# Patient Record
Sex: Female | Born: 1960 | Race: White | Hispanic: No | Smoking: Current every day smoker
Health system: Southern US, Community
[De-identification: ages and names within clinical notes are randomized; demographics above are authoritative.]

## PROBLEM LIST (undated history)

## (undated) DIAGNOSIS — K219 Gastro-esophageal reflux disease without esophagitis: Secondary | ICD-10-CM

## (undated) DIAGNOSIS — I10 Essential (primary) hypertension: Secondary | ICD-10-CM

## (undated) HISTORY — DX: Essential (primary) hypertension: I10

---

## 2010-08-13 ENCOUNTER — Encounter: Payer: Self-pay | Admitting: Family Medicine

## 2010-08-13 ENCOUNTER — Inpatient Hospital Stay (INDEPENDENT_AMBULATORY_CARE_PROVIDER_SITE_OTHER)
Admission: RE | Admit: 2010-08-13 | Discharge: 2010-08-13 | Disposition: A | Discharge status: Home / Self Care | Payer: BC Managed Care – PPO | Source: Ambulatory Visit | Attending: Family Medicine | Admitting: Family Medicine

## 2010-08-13 DIAGNOSIS — M7710 Lateral epicondylitis, unspecified elbow: Secondary | ICD-10-CM

## 2010-08-13 DIAGNOSIS — M533 Sacrococcygeal disorders, not elsewhere classified: Secondary | ICD-10-CM

## 2010-08-13 DIAGNOSIS — M65849 Other synovitis and tenosynovitis, unspecified hand: Secondary | ICD-10-CM

## 2010-08-13 DIAGNOSIS — S239XXA Sprain of unspecified parts of thorax, initial encounter: Secondary | ICD-10-CM

## 2010-08-13 DIAGNOSIS — M7520 Bicipital tendinitis, unspecified shoulder: Secondary | ICD-10-CM

## 2010-08-17 ENCOUNTER — Ambulatory Visit (INDEPENDENT_AMBULATORY_CARE_PROVIDER_SITE_OTHER): Payer: BC Managed Care – PPO | Admitting: Family Medicine

## 2010-08-17 ENCOUNTER — Encounter: Payer: Self-pay | Admitting: Family Medicine

## 2010-08-17 VITALS — BP 120/84 | HR 78 | Temp 98.0°F | Ht 60.0 in | Wt 111.4 lb

## 2010-08-17 DIAGNOSIS — S56919A Strain of unspecified muscles, fascia and tendons at forearm level, unspecified arm, initial encounter: Secondary | ICD-10-CM | POA: Insufficient documentation

## 2010-08-17 DIAGNOSIS — IMO0002 Reserved for concepts with insufficient information to code with codable children: Secondary | ICD-10-CM

## 2010-08-17 DIAGNOSIS — M542 Cervicalgia: Secondary | ICD-10-CM | POA: Insufficient documentation

## 2010-08-17 DIAGNOSIS — S239XXA Sprain of unspecified parts of thorax, initial encounter: Secondary | ICD-10-CM

## 2010-08-17 DIAGNOSIS — M549 Dorsalgia, unspecified: Secondary | ICD-10-CM

## 2010-08-17 DIAGNOSIS — S29019A Strain of muscle and tendon of unspecified wall of thorax, initial encounter: Secondary | ICD-10-CM

## 2010-08-17 MED ORDER — CYCLOBENZAPRINE HCL 10 MG PO TABS: 5.0000 mg | ORAL_TABLET | Freq: Three times a day (TID) | ORAL | Status: DC | PRN

## 2010-08-17 MED ORDER — TRAMADOL HCL 50 MG PO TABS: 50.0000 mg | ORAL_TABLET | Freq: Four times a day (QID) | ORAL | Status: DC | PRN

## 2010-08-17 NOTE — Patient Instructions (Signed)
Your exam is consistent with muscle strains related to overuse at work (from going 6 months without doing twisting, lifting, etc to doing this for 8 hours straight) and lifting things at home. Start physical therapy for your back and go 1-2 times a week up to 6 weeks. Out of work for the next 2 weeks until I see you back then I anticipate a return to work program. Continue with the flexeril as needed for muscle spasms. Take tylenol extra strength 2 tabs three times a day for pain. Tramadol 50mg  up to 4 times a day as needed for severe pain (no driving on this medicine). Heat 15 minutes at a time 3-4 times a day helps more with muscle strain/spasm. Follow up with me in 2 weeks for a recheck.

## 2010-08-17 NOTE — Assessment & Plan Note (Signed)
Lumbar, thoracic, forearm strains - patient's history and symptoms are consistent with overuse muscle strains of her back and left upper extremity.  She had been out of work from her physically demanding job for 6 months then went back to full duty 8+ hours (also moving boxes and furniture at home), leading to her current pain.  No red flag symptoms.  No findings to warrant imaging at this time.  No evidence of radiculopathy or tennis elbow.  Should resolve with time, heat, muscle relaxants, PT, pain management.  However, will need to be eased back into work if she is to continue with her current job description.  Anticipate doing so at follow-up in 2 weeks (2 hour days, transition over a few weeks to complete days).  See instructions for further.

## 2010-08-17 NOTE — Assessment & Plan Note (Signed)
Lumbar, thoracic, forearm strains - patient's history and symptoms are consistent with overuse muscle strains of her back and left upper extremity.  She had been out of work from her physically demanding job for 6 months then went back to full duty 8+ hours (also moving boxes and furniture at home), leading to her current pain.  No red flag symptoms.  No findings to warrant imaging at this time.  No evidence of radiculopathy or tennis elbow.  Should resolve with time, heat, muscle relaxants, PT, pain management.  However, will need to be eased back into work if she is to continue with her current job description.  Anticipate doing so at follow-up in 2 weeks (2 hour days, transition over a few weeks to complete days).  See instructions for further. 
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
## 2010-08-23 ENCOUNTER — Ambulatory Visit: Payer: BC Managed Care – PPO | Attending: Family Medicine | Admitting: Physical Therapy

## 2010-08-23 DIAGNOSIS — M6281 Muscle weakness (generalized): Secondary | ICD-10-CM | POA: Insufficient documentation

## 2010-08-23 DIAGNOSIS — M546 Pain in thoracic spine: Secondary | ICD-10-CM | POA: Insufficient documentation

## 2010-08-23 DIAGNOSIS — IMO0001 Reserved for inherently not codable concepts without codable children: Secondary | ICD-10-CM | POA: Insufficient documentation

## 2010-08-23 DIAGNOSIS — M542 Cervicalgia: Secondary | ICD-10-CM | POA: Insufficient documentation

## 2010-08-27 ENCOUNTER — Ambulatory Visit: Payer: BC Managed Care – PPO | Admitting: Physical Therapy

## 2010-08-30 ENCOUNTER — Encounter: Payer: BC Managed Care – PPO | Admitting: Physical Therapy

## 2010-08-31 ENCOUNTER — Ambulatory Visit (INDEPENDENT_AMBULATORY_CARE_PROVIDER_SITE_OTHER): Payer: BC Managed Care – PPO | Admitting: Family Medicine

## 2010-08-31 ENCOUNTER — Encounter: Payer: Self-pay | Admitting: Family Medicine

## 2010-08-31 VITALS — BP 135/81 | HR 92 | Temp 98.1°F | Ht 60.0 in | Wt 110.0 lb

## 2010-08-31 DIAGNOSIS — S29019A Strain of muscle and tendon of unspecified wall of thorax, initial encounter: Secondary | ICD-10-CM

## 2010-08-31 DIAGNOSIS — S239XXA Sprain of unspecified parts of thorax, initial encounter: Secondary | ICD-10-CM

## 2010-09-02 ENCOUNTER — Encounter: Payer: BC Managed Care – PPO | Admitting: Physical Therapy

## 2010-09-03 ENCOUNTER — Encounter: Payer: Self-pay | Admitting: Family Medicine
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
## 2010-09-03 NOTE — Assessment & Plan Note (Signed)
1. Lumbar, thoracic, forearm strains - patient's history and exam are mildly improved from last visit.  Continue with PT, home exercises, Tylenol, Tramadol heat.  Will return to work on Monday with limited hours only - 2 hours a day and slowly increase back to regular work activities and schedule over the next several weeks.

## 2010-09-06 ENCOUNTER — Ambulatory Visit: Payer: BC Managed Care – PPO | Attending: Family Medicine | Admitting: Physical Therapy

## 2010-09-06 DIAGNOSIS — M542 Cervicalgia: Secondary | ICD-10-CM | POA: Insufficient documentation

## 2010-09-06 DIAGNOSIS — IMO0001 Reserved for inherently not codable concepts without codable children: Secondary | ICD-10-CM | POA: Insufficient documentation

## 2010-09-06 DIAGNOSIS — M6281 Muscle weakness (generalized): Secondary | ICD-10-CM | POA: Insufficient documentation

## 2010-09-06 DIAGNOSIS — M546 Pain in thoracic spine: Secondary | ICD-10-CM | POA: Insufficient documentation

## 2010-09-09 ENCOUNTER — Encounter: Payer: BC Managed Care – PPO | Admitting: Physical Therapy

## 2010-09-13 ENCOUNTER — Ambulatory Visit: Payer: BC Managed Care – PPO | Admitting: Physical Therapy

## 2010-09-14 ENCOUNTER — Ambulatory Visit (HOSPITAL_BASED_OUTPATIENT_CLINIC_OR_DEPARTMENT_OTHER)
Admission: RE | Admit: 2010-09-14 | Discharge: 2010-09-14 | Disposition: A | Discharge status: Home / Self Care | Payer: BC Managed Care – PPO | Source: Ambulatory Visit | Attending: Family Medicine | Admitting: Family Medicine

## 2010-09-14 ENCOUNTER — Ambulatory Visit: Payer: BC Managed Care – PPO | Admitting: Family Medicine

## 2010-09-14 ENCOUNTER — Other Ambulatory Visit: Payer: Self-pay | Admitting: Family Medicine

## 2010-09-14 ENCOUNTER — Encounter: Payer: Self-pay | Admitting: Family Medicine

## 2010-09-14 DIAGNOSIS — M546 Pain in thoracic spine: Secondary | ICD-10-CM

## 2010-09-14 DIAGNOSIS — M542 Cervicalgia: Secondary | ICD-10-CM

## 2010-09-14 NOTE — Progress Notes (Deleted)
  Subjective:    Patient ID: Stephanie Mayer, female    DOB: 08/24/1960, 50 y.o.   MRN: [id number]  HPI    Review of Systems     Objective:   Physical Exam        Assessment & Plan:

## 2010-09-15 ENCOUNTER — Encounter: Payer: Self-pay | Admitting: Family Medicine
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
## 2010-09-17 ENCOUNTER — Ambulatory Visit: Payer: BC Managed Care – PPO | Admitting: Physical Therapy

## 2010-09-20 ENCOUNTER — Ambulatory Visit: Payer: BC Managed Care – PPO | Admitting: Physical Therapy

## 2010-09-23 ENCOUNTER — Ambulatory Visit: Payer: BC Managed Care – PPO

## 2010-09-28 ENCOUNTER — Ambulatory Visit: Payer: BC Managed Care – PPO | Admitting: Physical Therapy

## 2010-09-30 ENCOUNTER — Ambulatory Visit: Payer: BC Managed Care – PPO | Admitting: Physical Therapy

## 2010-10-04 ENCOUNTER — Encounter: Payer: BC Managed Care – PPO | Admitting: Physical Therapy

## 2010-10-05 ENCOUNTER — Encounter: Payer: Self-pay | Admitting: Family Medicine

## 2010-10-05 ENCOUNTER — Ambulatory Visit (INDEPENDENT_AMBULATORY_CARE_PROVIDER_SITE_OTHER): Payer: BC Managed Care – PPO | Admitting: Family Medicine

## 2010-10-05 VITALS — BP 137/88 | HR 90 | Temp 98.4°F | Ht 60.0 in | Wt 108.0 lb

## 2010-10-05 DIAGNOSIS — F329 Major depressive disorder, single episode, unspecified: Secondary | ICD-10-CM
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
## 2010-10-05 NOTE — Patient Instructions (Signed)
As we discussed before, we have to continue advancing you back to work. Starting Monday 8/6 can work 8 hour days, 8/13 10 hour days, and finall back to full duty on 8/20 - though you must be released to do your physical therapy twice a week for the next 4 weeks. Continue with home exercises and stretches. Continue heat, ok to take tylenol, tramadol as needed for pain. We will refer you to someone to talk more about the loss of your husband. Follow up with me in 4 weeks after you have finished physical therapy.

## 2010-10-07 ENCOUNTER — Ambulatory Visit: Payer: BC Managed Care – PPO | Attending: Family Medicine | Admitting: Physical Therapy

## 2010-10-07 DIAGNOSIS — M546 Pain in thoracic spine: Secondary | ICD-10-CM | POA: Insufficient documentation

## 2010-10-07 DIAGNOSIS — M6281 Muscle weakness (generalized): Secondary | ICD-10-CM | POA: Insufficient documentation

## 2010-10-07 DIAGNOSIS — IMO0001 Reserved for inherently not codable concepts without codable children: Secondary | ICD-10-CM | POA: Insufficient documentation

## 2010-10-07 DIAGNOSIS — M542 Cervicalgia: Secondary | ICD-10-CM | POA: Insufficient documentation

## 2010-10-12 ENCOUNTER — Ambulatory Visit: Payer: BC Managed Care – PPO | Admitting: Physical Therapy

## 2010-10-14 ENCOUNTER — Ambulatory Visit: Payer: BC Managed Care – PPO | Admitting: Physical Therapy

## 2010-10-18 ENCOUNTER — Ambulatory Visit: Payer: BC Managed Care – PPO | Admitting: Physical Therapy

## 2010-10-20 ENCOUNTER — Ambulatory Visit: Payer: BC Managed Care – PPO | Admitting: Physical Therapy

## 2010-10-22 ENCOUNTER — Encounter: Payer: BC Managed Care – PPO | Admitting: Physical Therapy

## 2010-10-25 ENCOUNTER — Encounter: Payer: BC Managed Care – PPO | Admitting: Physical Therapy

## 2010-10-26 ENCOUNTER — Ambulatory Visit: Payer: BC Managed Care – PPO | Admitting: Physical Therapy

## 2010-10-28 ENCOUNTER — Encounter: Payer: BC Managed Care – PPO | Admitting: Physical Therapy

## 2010-10-28 ENCOUNTER — Ambulatory Visit: Payer: BC Managed Care – PPO | Admitting: Physical Therapy

## 2010-11-01 ENCOUNTER — Encounter: Payer: BC Managed Care – PPO | Admitting: Physical Therapy

## 2010-11-02 ENCOUNTER — Encounter: Payer: Self-pay | Admitting: Family Medicine

## 2010-11-02 ENCOUNTER — Ambulatory Visit (INDEPENDENT_AMBULATORY_CARE_PROVIDER_SITE_OTHER): Payer: BC Managed Care – PPO | Admitting: Family Medicine

## 2010-11-02 DIAGNOSIS — S29019A Strain of muscle and tendon of unspecified wall of thorax, initial encounter: Secondary | ICD-10-CM

## 2010-11-02 DIAGNOSIS — F4321 Adjustment disorder with depressed mood: Secondary | ICD-10-CM

## 2010-11-02 DIAGNOSIS — S239XXA Sprain of unspecified parts of thorax, initial encounter: Secondary | ICD-10-CM
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
## 2010-11-02 NOTE — Assessment & Plan Note (Signed)
much improved though pain still a 3/10.  Continue with PT as this is helping.  Tylenol or aspirin as needed for pain.  Has stopped flexeril and tramadol.  See prior notes regarding work - has been cleared to go back medically and paperwork faxed to her employer.  This is being handled by her union as well per her report.   Discussed upper body strengthening along with her PT to minimize risk that pain recurs when she returns to work.  F/u in 4-6 weeks after she has completed PT.

## 2010-11-02 NOTE — Assessment & Plan Note (Signed)
?  Depression vs adjustment disorder vs normal bereavement - Has not yet heard from [facility] - we will contact them regarding this referral.

## 2010-11-04 ENCOUNTER — Encounter: Payer: BC Managed Care – PPO | Admitting: Physical Therapy

## 2010-11-04 ENCOUNTER — Ambulatory Visit: Payer: BC Managed Care – PPO | Admitting: Physical Therapy

## 2010-11-09 ENCOUNTER — Ambulatory Visit: Payer: BC Managed Care – PPO | Admitting: Physical Therapy

## 2010-11-11 ENCOUNTER — Ambulatory Visit: Payer: BC Managed Care – PPO | Attending: Family Medicine | Admitting: Physical Therapy

## 2010-11-11 DIAGNOSIS — M546 Pain in thoracic spine: Secondary | ICD-10-CM | POA: Insufficient documentation

## 2010-11-11 DIAGNOSIS — M6281 Muscle weakness (generalized): Secondary | ICD-10-CM | POA: Insufficient documentation

## 2010-11-11 DIAGNOSIS — IMO0001 Reserved for inherently not codable concepts without codable children: Secondary | ICD-10-CM | POA: Insufficient documentation

## 2010-11-11 DIAGNOSIS — M542 Cervicalgia: Secondary | ICD-10-CM | POA: Insufficient documentation

## 2010-11-16 ENCOUNTER — Ambulatory Visit: Payer: BC Managed Care – PPO | Admitting: Physical Therapy

## 2010-11-18 ENCOUNTER — Ambulatory Visit: Payer: BC Managed Care – PPO | Admitting: Physical Therapy

## 2010-11-23 ENCOUNTER — Ambulatory Visit: Payer: BC Managed Care – PPO | Admitting: Physical Therapy

## 2010-11-25 ENCOUNTER — Ambulatory Visit: Payer: BC Managed Care – PPO | Admitting: Physical Therapy

## 2010-11-30 ENCOUNTER — Ambulatory Visit: Payer: BC Managed Care – PPO | Admitting: Physical Therapy

## 2010-12-02 ENCOUNTER — Ambulatory Visit: Payer: BC Managed Care – PPO | Admitting: Physical Therapy

## 2010-12-14 ENCOUNTER — Encounter: Payer: Self-pay | Admitting: Family Medicine

## 2010-12-14 ENCOUNTER — Ambulatory Visit (INDEPENDENT_AMBULATORY_CARE_PROVIDER_SITE_OTHER): Payer: BC Managed Care – PPO | Admitting: Family Medicine

## 2010-12-14 DIAGNOSIS — S239XXA Sprain of unspecified parts of thorax, initial encounter: Secondary | ICD-10-CM

## 2010-12-14 DIAGNOSIS — F4321 Adjustment disorder with depressed mood: Secondary | ICD-10-CM

## 2010-12-14 DIAGNOSIS — S29019A Strain of muscle and tendon of unspecified wall of thorax, initial encounter: Secondary | ICD-10-CM

## 2010-12-14 NOTE — Assessment & Plan Note (Signed)
?  Depression vs adjustment disorder vs normal bereavement - She decided not to pursue treatment and feels much better - advised to call us or Behavioral Health if she changes her mind.
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
## 2010-12-14 NOTE — Assessment & Plan Note (Signed)
Significantly improved - advised to continue with home stretches and exercises indefinitely.  Tylenol as needed for pain.  Note again given for return to work.  F/u prn.
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
## 2011-02-07 NOTE — Letter (Signed)
Summary: Out of Work  [facility]  [address]   [place], [place] [address]   Phone: [phone]  Fax: [phone]    August 13, 2010   Employee:  [employer]    To Whom It May Concern:   For Medical reasons, please excuse the above named employee from work for the following dates:  Start:   08/13/10  End:  08/27/10  If you need additional information, please feel free to contact our office.         Sincerely,    Donna Christen MD

## 2011-10-03 ENCOUNTER — Emergency Department (INDEPENDENT_AMBULATORY_CARE_PROVIDER_SITE_OTHER): Payer: BC Managed Care – PPO

## 2011-10-03 ENCOUNTER — Emergency Department
Admission: EM | Admit: 2011-10-03 | Discharge: 2011-10-03 | Disposition: A | Discharge status: Home / Self Care | Payer: BC Managed Care – PPO | Source: Home / Self Care

## 2011-10-03 DIAGNOSIS — N390 Urinary tract infection, site not specified: Secondary | ICD-10-CM

## 2011-10-03 DIAGNOSIS — S39012A Strain of muscle, fascia and tendon of lower back, initial encounter: Secondary | ICD-10-CM

## 2011-10-03 DIAGNOSIS — M545 Low back pain: Secondary | ICD-10-CM

## 2011-10-03 LAB — POCT URINALYSIS DIP (MANUAL ENTRY)
Blood, UA: NEGATIVE
Ketones, POC UA: NEGATIVE
Protein Ur, POC: 30
Spec Grav, UA: 1.02 (ref 1.005–1.03)
pH, UA: 7.5 (ref 5–8)

## 2011-10-03 MED ORDER — CEPHALEXIN 500 MG PO CAPS: 500.0000 mg | ORAL_CAPSULE | Freq: Three times a day (TID) | ORAL | Status: AC

## 2011-10-03 MED ORDER — CYCLOBENZAPRINE HCL 5 MG PO TABS: 5.0000 mg | ORAL_TABLET | Freq: Every evening | ORAL | Status: AC | PRN
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
## 2011-10-03 NOTE — ED Notes (Signed)
Stephanie Mayer complains of low back pain for 3 days. She describes the pain as stabbing and 9/10 on the pain scale. She has had some fever and sweats. Denies painful urination.

## 2011-10-05 ENCOUNTER — Telehealth: Payer: Self-pay | Admitting: *Deleted

## 2011-10-05 LAB — URINE CULTURE: Colony Count: NO GROWTH

## 2011-10-06 NOTE — ED Provider Notes (Signed)
Agree with exam, assessment, and plan.   Lattie Haw, MD 10/06/11 1100

## 2012-02-06 ENCOUNTER — Emergency Department
Admission: EM | Admit: 2012-02-06 | Discharge: 2012-02-06 | Disposition: A | Discharge status: Home / Self Care | Payer: BC Managed Care – PPO | Source: Home / Self Care | Attending: Family Medicine | Admitting: Family Medicine

## 2012-02-06 ENCOUNTER — Encounter: Payer: Self-pay | Admitting: Emergency Medicine

## 2012-02-06 DIAGNOSIS — J069 Acute upper respiratory infection, unspecified: Secondary | ICD-10-CM

## 2012-02-06 DIAGNOSIS — R030 Elevated blood-pressure reading, without diagnosis of hypertension: Secondary | ICD-10-CM

## 2012-02-06 LAB — POCT CBC W AUTO DIFF (K'VILLE URGENT CARE)

## 2012-02-06 MED ORDER — DOXYCYCLINE HYCLATE 100 MG PO CAPS: 100.0000 mg | ORAL_CAPSULE | Freq: Two times a day (BID) | ORAL | Status: DC

## 2012-02-06 MED ORDER — BENZONATATE 200 MG PO CAPS: 200.0000 mg | ORAL_CAPSULE | Freq: Every day | ORAL | Status: DC

## 2012-02-06 NOTE — ED Notes (Signed)
Nausea, chills x 2 days

## 2012-02-06 NOTE — ED Provider Notes (Signed)
History     CSN: [id number]  Arrival date & time 02/06/12  4098   First MD Initiated Contact with Patient 02/06/12 [phone]      Chief Complaint  Patient presents with  . Nausea     HPI Comments: Patient complains of approximately 7 day history of non-productive mild cough.  Yesterday she awoke with chills/sweats, intermittent dizziness, headache, myalgias, and nasal congestion. There has been no pleuritic pain, shortness of breath, or wheezes.  No diarrhea.  The history is provided by the patient.    History reviewed. No pertinent past medical history.  History reviewed. No pertinent past surgical history.  Family History  Problem Relation Age of Onset  . Diabetes Neg Hx   . Heart attack Neg Hx   . Hypertension Neg Hx     History  Substance Use Topics  . Smoking status: Current Every Day Smoker -- 1.0 packs/day for 25 years    Types: Cigarettes  . Smokeless tobacco: Not on file  . Alcohol Use: Yes    OB History    Grav Para Term Preterm Abortions TAB SAB Ect Mult Living                  Review of Systems No sore throat + cough No pleuritic pain No wheezing + nasal congestion ? post-nasal drainage No sinus pain/pressure No itchy/red eyes No earache + dizziness No hemoptysis No SOB No fever, + chills/sweats + nausea No vomiting No abdominal pain No diarrhea No urinary symptoms No skin rashes + fatigue + myalgias + headache Used OTC meds without relief  Allergies  Review of patient's allergies indicates not on file.  Home Medications   Current Outpatient Rx  Name  Route  Sig  Dispense  Refill  . BENZONATATE 200 MG PO CAPS   Oral   Take 1 capsule (200 mg total) by mouth at bedtime. Take as needed for cough   12 capsule   0   . DOXYCYCLINE HYCLATE 100 MG PO CAPS   Oral   Take 1 capsule (100 mg total) by mouth 2 (two) times daily.   20 capsule   0     BP 177/100  Pulse 86  Temp 98.1 F (36.7 C) (Oral)  Resp 18  Ht 5' (1.524 m)  Wt  109 lb (49.442 kg)  BMI 21.29 kg/m2  SpO2 98%  Physical Exam Nursing notes and Vital Signs reviewed. Appearance:  Patient appears somewhat older than stated age, and in no acute distress Eyes:  Pupils are equal, round, and reactive to light and accomodation.  Extraocular movement is intact.  Conjunctivae are not inflamed  Ears:  Canals normal.  Tympanic membranes normal.  Nose:  Mildly congested turbinates.  No sinus tenderness.    Pharynx:  Normal; moist mucous membranes  Neck:  Supple.  Tender shotty posterior nodes are palpated bilaterally  Lungs:  Clear to auscultation.  Breath sounds are equal.  Heart:  Regular rate and rhythm without murmurs, rubs, or gallops.  Abdomen:  Nontender without masses or hepatosplenomegaly.  Bowel sounds are present.  No CVA or flank tenderness.  Extremities:  No edema.  No calf tenderness Skin:  No rash present.   ED Course  Procedures none   Labs Reviewed  POCT CBC W AUTO DIFF ([facility]) :  WBC 5.4; LY 27.8; MO 6.7; GR 65.5; Hgb 14.8; Platelets 226       1. Acute upper respiratory infections of unspecified site; ? Early bronchitis  in a smoker   2. Elevated blood-pressure reading without diagnosis of hypertension       MDM  Begin doxycycline.  Prescription written for Benzonatate [facility]) to take at bedtime for night-time cough.  Take plain Mucinex (guaifenesin) twice daily for cough and congestion.  Increase fluid intake, rest. May use Afrin nasal spray (or generic oxymetazoline) twice daily for about 5 days.  Also recommend using saline nasal spray several times daily and saline nasal irrigation (AYR is a common brand) Stop all antihistamines for now, and other non-prescription cough/cold preparations. Recommend flu shot when well Follow-up with family doctor for Blood Pressure         Lattie Haw, MD 02/06/12 1043

## 2012-02-08 ENCOUNTER — Encounter: Payer: Self-pay | Admitting: Physician Assistant

## 2012-02-08 ENCOUNTER — Ambulatory Visit (INDEPENDENT_AMBULATORY_CARE_PROVIDER_SITE_OTHER): Payer: BC Managed Care – PPO | Admitting: Physician Assistant

## 2012-02-08 VITALS — BP 149/98 | HR 112 | Ht 60.0 in | Wt 111.0 lb

## 2012-02-08 DIAGNOSIS — R232 Flushing: Secondary | ICD-10-CM

## 2012-02-08 DIAGNOSIS — R928 Other abnormal and inconclusive findings on diagnostic imaging of breast: Secondary | ICD-10-CM

## 2012-02-08 DIAGNOSIS — I10 Essential (primary) hypertension: Secondary | ICD-10-CM | POA: Insufficient documentation

## 2012-02-08 DIAGNOSIS — N951 Menopausal and female climacteric states: Secondary | ICD-10-CM

## 2012-02-08 MED ORDER — LISINOPRIL-HYDROCHLOROTHIAZIDE 20-12.5 MG PO TABS: 1.0000 | ORAL_TABLET | Freq: Every day | ORAL | Status: DC

## 2012-02-08 NOTE — Patient Instructions (Addendum)
ESTroven over the counter for Hot flashes.   Start Lisinopril/HCTZ daily.

## 2012-02-08 NOTE — Progress Notes (Signed)
  Subjective:    Patient ID: Stephanie Mayer, female    DOB: 1960/03/22, 51 y.o.   MRN: [id number]  HPI Patient is a 51 yo female who presents to the clinic to establish care and talk about high blood pressure. PMH reviewed and negative for any chronic diseases. She has never been followed by Primary Care physician. She has had elevated blood pressure for a while but has not done anything about it. Sometimes it does get into the 170's over 100's. She denies any CP, palpitations, numbness or tingling of extremities. She has been having lots of headaches in between temples and at the top of her head. Ibuprofen helps some. She has had occasional episodes of dizziness when she stands. She has never tried anything for BP.   She has smoked for 20 years. She is aware of risk but not wanting to stop at this point.   Family history positive for Breast Cancer and HTN. She has not had period in 3 years due to early menopause. Mammogram is supposed to be done every 6 months patient is scheduled to have mammogram at the end of this month.    Review of Systems     Objective:   Physical Exam  Constitutional: She is oriented to person, place, and time. She appears well-developed and well-nourished.  HENT:  Head: Normocephalic and atraumatic.  Cardiovascular: Regular rhythm, normal heart sounds and intact distal pulses.        Tachycardia at 112.  Pulmonary/Chest: Effort normal and breath sounds normal. She has no wheezes.  Neurological: She is alert and oriented to person, place, and time.  Skin: Skin is warm and dry.  Psychiatric: She has a normal mood and affect. Her behavior is normal.          Assessment & Plan:  Hypertension/Headaches/Dizziness- Reassured pt that I suspect HA's and dizziness might be coming from BP issues. Will start Lisinopril/HCTZ daily. Talked about side effects and patient is aware to call with any side effects and we can change rx. Continue ibuprofen/tylenol for HA's. Dizziness  might be some orthostatic in origin. Encouraged pt to get up slower and hold on to something to avoid a fall. Follow up within a month to recheck BP and symptoms.   Hot flashes- Discussed with patient that since she has a family hx of breast cancer and has already had suspicious mass herself that HRT is not an option. Encouraged her to try Estroven OTC, symptomatic treatment and consider acupuncture. Call with questions or concerns.   Patient reports that she had blood work recently drawn with [facility]. She signed for [place] to receive records. Encouraged patient to do this so that we can make sure no other issues need to be managed.   Pt declined Tdap and flu shot. She does not like needles. She states she will think about it. She is aware of risk.

## 2012-02-18 ENCOUNTER — Emergency Department
Admission: EM | Admit: 2012-02-18 | Discharge: 2012-02-18 | Disposition: A | Discharge status: Home / Self Care | Payer: BC Managed Care – PPO | Source: Home / Self Care | Attending: Family Medicine | Admitting: Family Medicine

## 2012-02-18 DIAGNOSIS — M7522 Bicipital tendinitis, left shoulder: Secondary | ICD-10-CM

## 2012-02-18 DIAGNOSIS — M7520 Bicipital tendinitis, unspecified shoulder: Secondary | ICD-10-CM

## 2012-02-18 DIAGNOSIS — S29012A Strain of muscle and tendon of back wall of thorax, initial encounter: Secondary | ICD-10-CM

## 2012-02-18 DIAGNOSIS — S239XXA Sprain of unspecified parts of thorax, initial encounter: Secondary | ICD-10-CM

## 2012-02-18 MED ORDER — TRAMADOL HCL 50 MG PO TABS: ORAL_TABLET | ORAL | Status: DC

## 2012-02-18 MED ORDER — CYCLOBENZAPRINE HCL 5 MG PO TABS: ORAL_TABLET | ORAL | Status: DC

## 2012-02-18 MED ORDER — PREDNISONE 20 MG PO TABS: 20.0000 mg | ORAL_TABLET | Freq: Two times a day (BID) | ORAL | Status: DC

## 2012-02-18 MED ORDER — CYCLOBENZAPRINE HCL 10 MG PO TABS: ORAL_TABLET | ORAL | Status: DC

## 2012-02-18 NOTE — ED Provider Notes (Signed)
History     CSN: [id number]  Arrival date & time 02/18/12  0910   First MD Initiated Contact with Patient 02/18/12 [phone]      Chief Complaint  Patient presents with  . 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  About two weeks ago she was required to lift heavier components and she subsequently developed pain in her left upper back near shoulder blade that has persisted.  Patient is a 51 y.o. female presenting with back pain. The history is provided by the patient and the spouse.  Back Pain  This is a recurrent problem. Episode onset: 2 weeks ago. The problem occurs constantly. The problem has not changed since onset.The pain is associated with lifting heavy objects. Pain location: left upper back. Quality: throbbing. The pain does not radiate. The pain is at a severity of 10/10. The pain is moderate. Exacerbated by: raising left arm. The pain is the same all the time. Pertinent negatives include no chest pain, no fever, no numbness, no weight loss, no headaches, no abdominal pain, no paresthesias and no weakness.    Past Medical History  Diagnosis Date  . Hypertension     History reviewed. No pertinent past surgical history.  Family History  Problem Relation Age of Onset  . Diabetes Neg Hx   . Heart attack Neg Hx   . Hypertension Father   . Cancer Maternal Aunt     breast    History  Substance Use Topics  . Smoking status: Current Every Day Smoker -- 1.0 packs/day for 25 years    Types: Cigarettes  . Smokeless tobacco: Not on file  . Alcohol Use: Yes    OB History    Grav Para Term Preterm Abortions TAB SAB Ect Mult Living                  Review of Systems  Constitutional: Negative for fever and weight loss.  Cardiovascular: Negative for chest pain.  Gastrointestinal: Negative for abdominal pain.  Musculoskeletal: Positive for back pain.  Neurological: Negative for weakness, numbness, headaches and  paresthesias.  All other systems reviewed and are negative.    Allergies  Review of patient's allergies indicates no known allergies.  Home Medications   Current Outpatient Rx  Name  Route  Sig  Dispense  Refill  . BENZONATATE 200 MG PO CAPS   Oral   Take 1 capsule (200 mg total) by mouth at bedtime. Take as needed for cough   12 capsule   0   . CYCLOBENZAPRINE HCL 10 MG PO TABS      Take one tab by mouth 2 or 3 times daily for muscle spasm   20 tablet   0   . DOXYCYCLINE HYCLATE 100 MG PO CAPS   Oral   Take 1 capsule (100 mg total) by mouth 2 (two) times daily.   20 capsule   0   . LISINOPRIL-HYDROCHLOROTHIAZIDE 20-12.5 MG PO TABS   Oral   Take 1 tablet by mouth daily.   30 tablet   0   . PREDNISONE 20 MG PO TABS   Oral   Take 1 tablet (20 mg total) by mouth 2 (two) times daily.   10 tablet   0   . TRAMADOL HCL 50 MG PO TABS      Take one tab by mouth at bedtime as needed for pain   10 tablet   0  BP 99/68  Pulse 79  Temp 98 F (36.7 C) (Oral)  Resp 20  Ht 5' (1.524 m)  Wt 111 lb (50.349 kg)  BMI 21.68 kg/m2  SpO2 99%  Physical Exam Nursing notes and Vital Signs reviewed. Appearance:  Patient appears healthy, stated age, and in no acute distress Eyes:  Pupils are equal, round, and reactive to light and accomodation.  Extraocular movement is intact.  Conjunctivae are not inflamed  Pharynx:  Normal Neck:  Supple.  No adenopathy.  Mild tenderness left trapezius. Lungs:  Clear to auscultation.  Breath sounds are equal.  Heart:  Regular rate and rhythm without murmurs, rubs, or gallops.  Back:  Distinct tenderness over medial edge of left scapula. Left shoulder:  full range of motion.  Normal strength.  Tenderness over insertions of biceps tendons.  Yergason test positive. Skin:  No rash present.   ED Course  Procedures none      1. Rhomboid muscle strain   2. Biceps tendonitis on left       MDM  Begin prednisone burst.  Rx for  Flexeril.  Tramadol at bedtime prn. Apply ice pack 2 or 3 times daily for 30 minutes.  Begin range of motion exercises as per instruction sheets ([facility] information and instruction handout given)  Followup appt with Dr. Rodney Langton in five days for further management.        Lattie Haw, MD 02/21/12 [phone]

## 2012-02-18 NOTE — ED Notes (Signed)
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

## 2012-02-21 ENCOUNTER — Telehealth: Payer: Self-pay | Admitting: Emergency Medicine

## 2012-02-21 ENCOUNTER — Ambulatory Visit: Payer: BC Managed Care – PPO | Admitting: Family Medicine

## 2012-02-24 ENCOUNTER — Ambulatory Visit (INDEPENDENT_AMBULATORY_CARE_PROVIDER_SITE_OTHER): Payer: BC Managed Care – PPO | Admitting: Sports Medicine

## 2012-02-24 ENCOUNTER — Encounter: Payer: Self-pay | Admitting: Physician Assistant

## 2012-02-24 ENCOUNTER — Institutional Professional Consult (permissible substitution): Payer: BC Managed Care – PPO | Admitting: Sports Medicine

## 2012-02-24 ENCOUNTER — Ambulatory Visit (INDEPENDENT_AMBULATORY_CARE_PROVIDER_SITE_OTHER): Payer: BC Managed Care – PPO | Admitting: Physician Assistant

## 2012-02-24 ENCOUNTER — Encounter: Payer: Self-pay | Admitting: Sports Medicine

## 2012-02-24 VITALS — BP 94/70 | HR 86 | Wt 111.0 lb

## 2012-02-24 VITALS — BP 94/70 | HR 86 | Resp 16 | Ht 60.0 in | Wt 111.0 lb

## 2012-02-24 DIAGNOSIS — S239XXA Sprain of unspecified parts of thorax, initial encounter: Secondary | ICD-10-CM

## 2012-02-24 DIAGNOSIS — R42 Dizziness and giddiness: Secondary | ICD-10-CM

## 2012-02-24 DIAGNOSIS — R928 Other abnormal and inconclusive findings on diagnostic imaging of breast: Secondary | ICD-10-CM

## 2012-02-24 DIAGNOSIS — I10 Essential (primary) hypertension: Secondary | ICD-10-CM

## 2012-02-24 DIAGNOSIS — R062 Wheezing: Secondary | ICD-10-CM

## 2012-02-24 DIAGNOSIS — S29019A Strain of muscle and tendon of unspecified wall of thorax, initial encounter: Secondary | ICD-10-CM

## 2012-02-24 MED ORDER — MELOXICAM 15 MG PO TABS: ORAL_TABLET | ORAL | Status: DC

## 2012-02-24 MED ORDER — LISINOPRIL-HYDROCHLOROTHIAZIDE 10-12.5 MG PO TABS: 1.0000 | ORAL_TABLET | Freq: Every day | ORAL | Status: DC

## 2012-02-24 NOTE — Progress Notes (Addendum)
  Subjective:    Patient ID: Stephanie Mayer, female    DOB: Jun 12, 1960, 51 y.o.   MRN: [id number]  HPI Patient presents to the clinic to follow up on blood pressure. She has been taking lisinopril/HCTZ daily. She has felt a lot of dizziness over the past 2 weeks. She went to UC and was given Doxycycline and Tessalon pearle for URI. She denies any CP, palpitations, SOB, HA's. She is drinking adequately and staying hydrated. She has been strict about a low salt diet. She has not passed out or blacked out. She still has a cough and congested. Her dizziness is not associated with changes in position.    Review of Systems     Objective:   Physical Exam  Constitutional: She is oriented to person, place, and time. She appears well-developed and well-nourished.  HENT:  Head: Normocephalic and atraumatic.  Cardiovascular: Normal rate, regular rhythm and normal heart sounds.   Pulmonary/Chest: Effort normal.       Bilateral wheezing in rhonchi heard more at the base of both lungs.  Neurological: She is alert and oriented to person, place, and time.  Skin: Skin is warm and dry.  Psychiatric: She has a normal mood and affect. Her behavior is normal.          Assessment & Plan:  HTN/Dizziness- BP was too low today and I suspect that Dizzines is a result of blood pressure and recent URI and inner ear issues. Reassured patient. I did decrease her BP pill today. She was instructed to recheck BP at pharmacy and if not trending up to give [place] a call. Goal would be 115's over 80's. Continue low salt diet. Followup in 2-3 months for blood pressure recheck. If BP not increasing or increasing too much pleased office a call.  Wheezing on auscultation-aware that patient is being treated for URI with doxy. Encouraged patient to take all of medication. She has also recently been given prednisone burst do to some shoulder strain. I encouraged her to finish the prednisone completely. If not improving or feels short of  breath followup in office.  Abnormal mammogram- Pt supposed to be getting mammograms every 6 months. She went to have a mammogram last month and felt like the office is very confused and waited for over an hour and never will call back. She would like a new referral to another office. Will put in referral today.
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
## 2012-02-24 NOTE — Assessment & Plan Note (Signed)
I did off her injections into her painful trigger point. She declines these. She will do formal physical therapy, meloxicam, continue her muscle relaxers. I will also write her a note with some work restrictions. She will come back to see me in 4 weeks, if no better we can consider interventional treatment.

## 2012-03-12 ENCOUNTER — Ambulatory Visit: Payer: BC Managed Care – PPO | Admitting: Physical Therapy

## 2012-03-19 ENCOUNTER — Ambulatory Visit: Payer: BC Managed Care – PPO | Attending: Sports Medicine | Admitting: Physical Therapy

## 2012-03-19 DIAGNOSIS — M6281 Muscle weakness (generalized): Secondary | ICD-10-CM | POA: Insufficient documentation

## 2012-03-19 DIAGNOSIS — IMO0001 Reserved for inherently not codable concepts without codable children: Secondary | ICD-10-CM | POA: Insufficient documentation

## 2012-03-19 DIAGNOSIS — M25519 Pain in unspecified shoulder: Secondary | ICD-10-CM | POA: Insufficient documentation

## 2012-03-19 DIAGNOSIS — M25619 Stiffness of unspecified shoulder, not elsewhere classified: Secondary | ICD-10-CM | POA: Insufficient documentation

## 2012-03-22 ENCOUNTER — Ambulatory Visit: Payer: BC Managed Care – PPO | Admitting: Physical Therapy

## 2012-03-26 ENCOUNTER — Ambulatory Visit: Payer: BC Managed Care – PPO | Admitting: Physical Therapy

## 2012-03-27 ENCOUNTER — Ambulatory Visit (INDEPENDENT_AMBULATORY_CARE_PROVIDER_SITE_OTHER): Payer: BC Managed Care – PPO

## 2012-03-27 ENCOUNTER — Ambulatory Visit (INDEPENDENT_AMBULATORY_CARE_PROVIDER_SITE_OTHER): Payer: BC Managed Care – PPO | Admitting: Sports Medicine

## 2012-03-27 ENCOUNTER — Encounter: Payer: Self-pay | Admitting: Sports Medicine

## 2012-03-27 VITALS — BP 100/61 | HR 99 | Wt 110.0 lb

## 2012-03-27 DIAGNOSIS — S29012A Strain of muscle and tendon of back wall of thorax, initial encounter: Secondary | ICD-10-CM

## 2012-03-27 DIAGNOSIS — M542 Cervicalgia: Secondary | ICD-10-CM

## 2012-03-27 DIAGNOSIS — S239XXA Sprain of unspecified parts of thorax, initial encounter: Secondary | ICD-10-CM

## 2012-03-27 MED ORDER — GABAPENTIN 300 MG PO CAPS: ORAL_CAPSULE | ORAL | Status: DC

## 2012-03-27 NOTE — Assessment & Plan Note (Signed)
Now with predominantly left-sided pain and burning with periscapular trigger point. She also has numbness and tingling into both hands but predominantly the left side. She also has a suppressed left-sided biceps reflex. 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 Combined with improvement on prednisone in the past I suspect her neck may be the primary pathology. We will x-ray her neck, and start gabapentin. I would like to see her back after formal physical therapy is finished at which point we can certainly consider an MRI for interventional injection planning.

## 2012-03-27 NOTE — Progress Notes (Signed)
SPORTS MEDICINE CONSULTATION REPORT  Subjective:    CC: Followup  HPI: Neck and back pain: Jessyca again localizes her pain in the left periscapular muscles. She also notes numbness and tingling in both hands but worse on the left side in the C6 distribution. She has only had 3 sessions of physical therapy. To recap, she did improve with Mobic, cyclobenzaprine, and prednisone. Pain radiates as above, is moderate.  Past medical history, Surgical history, Family history not pertinant except as noted below, Social history, Allergies, and medications have been entered into the medical record, reviewed, and no changes needed.   Review of Systems: No headache, visual changes, nausea, vomiting, diarrhea, constipation, dizziness, abdominal pain, skin rash, fevers, chills, night sweats, weight loss, swollen lymph nodes, body aches, joint swelling, muscle aches, chest pain, shortness of breath, mood changes, visual or auditory hallucinations.   Objective:   Vitals:  Afebrile, vital signs stable. General: Well Developed, well nourished, and in no acute distress.  Neuro/Psych: Alert and oriented x3, extra-ocular muscles intact, able to move all 4 extremities, sensation grossly intact. Skin: Warm and dry, no rashes noted.  Respiratory: Not using accessory muscles, speaking in full sentences, trachea midline.  Cardiovascular: Pulses palpable, no extremity edema. Abdomen: Does not appear distended. Neck: Inspection unremarkable. No palpable stepoffs. Tender to palpation of her left periscapular muscles. Negative Spurling's maneuver. Full neck range of motion Grip strength and sensation normal in bilateral hands Strength good C4 to T1 distribution No sensory change to C4 to T1 Negative Hoffman sign bilaterally Reflexes 2+ throughout with the exception of the left biceps which is 1+.  C-spine x-ray shows straightening of the normal cervical lordosis, disc spaces overall unremarkable. I do not see much  spondylosis either.  Impression and Recommendations:   This case required medical decision making of moderate complexity.

## 2012-03-29 ENCOUNTER — Ambulatory Visit: Payer: BC Managed Care – PPO | Admitting: Physical Therapy

## 2012-04-02 ENCOUNTER — Ambulatory Visit: Payer: BC Managed Care – PPO | Admitting: Physical Therapy

## 2012-04-05 ENCOUNTER — Ambulatory Visit: Payer: BC Managed Care – PPO | Admitting: Physical Therapy

## 2012-04-09 ENCOUNTER — Ambulatory Visit: Payer: BC Managed Care – PPO | Attending: Sports Medicine | Admitting: Physical Therapy

## 2012-04-09 DIAGNOSIS — M6281 Muscle weakness (generalized): Secondary | ICD-10-CM | POA: Insufficient documentation

## 2012-04-09 DIAGNOSIS — M25619 Stiffness of unspecified shoulder, not elsewhere classified: Secondary | ICD-10-CM | POA: Insufficient documentation

## 2012-04-09 DIAGNOSIS — M25519 Pain in unspecified shoulder: Secondary | ICD-10-CM | POA: Insufficient documentation

## 2012-04-09 DIAGNOSIS — IMO0001 Reserved for inherently not codable concepts without codable children: Secondary | ICD-10-CM | POA: Insufficient documentation

## 2012-04-12 ENCOUNTER — Ambulatory Visit: Payer: BC Managed Care – PPO | Admitting: Physical Therapy

## 2012-04-13 ENCOUNTER — Ambulatory Visit: Payer: BC Managed Care – PPO | Admitting: Physical Therapy

## 2012-04-16 ENCOUNTER — Encounter: Payer: BC Managed Care – PPO | Admitting: Physical Therapy

## 2012-04-18 ENCOUNTER — Telehealth: Payer: Self-pay | Admitting: *Deleted

## 2012-04-18 NOTE — Telephone Encounter (Signed)
Pt has called stating that the nurse from her job is sending over paperwork for you to sign and to fax back to [phone]. She states if you have any questions then she call be called at [phone].

## 2012-04-20 ENCOUNTER — Ambulatory Visit: Payer: BC Managed Care – PPO | Admitting: Physical Therapy

## 2012-04-24 ENCOUNTER — Ambulatory Visit: Payer: BC Managed Care – PPO | Admitting: Physical Therapy

## 2012-04-25 ENCOUNTER — Ambulatory Visit (INDEPENDENT_AMBULATORY_CARE_PROVIDER_SITE_OTHER): Payer: BC Managed Care – PPO | Admitting: Physician Assistant

## 2012-04-25 ENCOUNTER — Encounter: Payer: Self-pay | Admitting: Physician Assistant

## 2012-04-25 VITALS — BP 122/83 | HR 100 | Wt 110.0 lb

## 2012-04-25 DIAGNOSIS — Z4802 Encounter for removal of sutures: Secondary | ICD-10-CM

## 2012-04-25 DIAGNOSIS — R928 Other abnormal and inconclusive findings on diagnostic imaging of breast: Secondary | ICD-10-CM

## 2012-04-25 DIAGNOSIS — I10 Essential (primary) hypertension: Secondary | ICD-10-CM

## 2012-04-25 NOTE — Progress Notes (Signed)
  Subjective:    Patient ID: Stephanie Mayer, female    DOB: [birth year], 52 y.o.   MRN: [id number]  HPI Patient presents to clinic to have sutures removed from forehead. Patient had a fall do to low blood pressure one week ago in her bathroom standing up from bowel movement. She went to the emergency room and blood pressure was 70/40. She did not lose consciousness. Her blood pressure has been decreasing and we were slowly decreasing her medication. She had extremely high blood pressure back in December that has decreased since. She has completely gone off blood pressure medicine since fall and feels much better. No CP, dizziness, falling since.   Patient still not gotten mammogram.   Review of Systems     Objective:   Physical Exam  Constitutional: She is oriented to person, place, and time. She appears well-developed and well-nourished.  HENT:  Laceration vertical above right eye starting at hairline 2 inches long, well healed. No signs of infection.   Cardiovascular: Normal rate, regular rhythm and normal heart sounds.   Pulmonary/Chest: Effort normal and breath sounds normal.  Neurological: She is alert and oriented to person, place, and time.  Skin: Skin is warm and dry.  Psychiatric: She has a normal mood and affect. Her behavior is normal.          Assessment & Plan:  Fall- Concludes that hypotension due to medication. Off meds and needs to stay off. Suture removed today continue to monitor her blood pressure for any changes.   Abdnormal mammogram- ordered again. Told to call within 2-3 days if not called with appointment.

## 2012-04-25 NOTE — Patient Instructions (Addendum)
Will get mammogram. Stop BP medication. Continue to follow up periodically.

## 2012-04-26 ENCOUNTER — Encounter: Payer: Self-pay | Admitting: Sports Medicine

## 2012-04-26 DIAGNOSIS — Z0289 Encounter for other administrative examinations: Secondary | ICD-10-CM

## 2012-04-26 NOTE — Progress Notes (Signed)
Patient ID: Stephanie Mayer, female   DOB: [birth year], 52 y.o.   MRN: [id number] Disability forms filled out. Patient is to followup with me after formal physical therapy before any further management or treatment.

## 2012-04-27 ENCOUNTER — Ambulatory Visit: Payer: BC Managed Care – PPO | Admitting: Family Medicine

## 2012-04-27 ENCOUNTER — Ambulatory Visit: Payer: BC Managed Care – PPO | Admitting: Physical Therapy

## 2012-04-30 ENCOUNTER — Telehealth: Payer: Self-pay | Admitting: *Deleted

## 2012-04-30 DIAGNOSIS — R928 Other abnormal and inconclusive findings on diagnostic imaging of breast: Secondary | ICD-10-CM

## 2012-04-30 NOTE — Telephone Encounter (Signed)
Imaging center calls and stated that they got an order for diagnostic mammogram and this needs to be entered for the [facility] location

## 2012-04-30 NOTE — Telephone Encounter (Signed)
Sent new referral to breast clinic. Make sure patient has never been here before. She did not like the last breast imaging center and requested a change.

## 2012-05-28 ENCOUNTER — Encounter: Payer: Self-pay | Admitting: Sports Medicine

## 2012-05-28 ENCOUNTER — Ambulatory Visit (INDEPENDENT_AMBULATORY_CARE_PROVIDER_SITE_OTHER): Payer: BC Managed Care – PPO | Admitting: Sports Medicine

## 2012-05-28 VITALS — BP 134/89 | HR 85 | Wt 108.0 lb

## 2012-05-28 DIAGNOSIS — M542 Cervicalgia: Secondary | ICD-10-CM
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
## 2012-06-01 ENCOUNTER — Encounter: Payer: Self-pay | Admitting: Sports Medicine

## 2012-06-01 NOTE — Progress Notes (Signed)
Disability forms filled out, chart sheet filled out.

## 2012-06-05 ENCOUNTER — Ambulatory Visit (HOSPITAL_BASED_OUTPATIENT_CLINIC_OR_DEPARTMENT_OTHER)
Admission: RE | Admit: 2012-06-05 | Discharge: 2012-06-05 | Disposition: A | Discharge status: Home / Self Care | Payer: BC Managed Care – PPO | Source: Ambulatory Visit | Attending: Sports Medicine | Admitting: Sports Medicine

## 2012-06-05 DIAGNOSIS — M542 Cervicalgia: Secondary | ICD-10-CM

## 2012-06-05 DIAGNOSIS — R209 Unspecified disturbances of skin sensation: Secondary | ICD-10-CM | POA: Insufficient documentation

## 2012-06-05 DIAGNOSIS — M5030 Other cervical disc degeneration, unspecified cervical region: Secondary | ICD-10-CM | POA: Insufficient documentation

## 2012-06-08 ENCOUNTER — Ambulatory Visit (INDEPENDENT_AMBULATORY_CARE_PROVIDER_SITE_OTHER): Payer: BC Managed Care – PPO | Admitting: Sports Medicine

## 2012-06-08 ENCOUNTER — Encounter: Payer: Self-pay | Admitting: Sports Medicine

## 2012-06-08 VITALS — BP 133/88 | HR 83 | Wt 109.0 lb

## 2012-06-08 DIAGNOSIS — M542 Cervicalgia: Secondary | ICD-10-CM

## 2012-06-08 MED ORDER — AMITRIPTYLINE HCL 50 MG PO TABS: ORAL_TABLET | ORAL | Status: DC

## 2012-06-08 NOTE — Assessment & Plan Note (Addendum)
MRI shows age-appropriate C5-C6 and C6-C7 mild degenerative disc disease but does not impinge on the thecal sac, or the neural foramina. She is off all medicines. I'm going to add amitriptyline 25 mg at bedtime for one week then increased to 50 mg. I also like her to see a pain management doctor for possible nerve conduction studies to see if the site of impingement is outside of the spine. I would like to touch base with Stephanie Mayer one more time in a month to see things are going. I see nothing at this point objectively that will make it unsafe for her to work. I filled out her work note allowing her to go back to work in 2 weeks full duty.

## 2012-06-08 NOTE — Progress Notes (Signed)
   Subjective:    CC: Followup  HPI: I'm seeing this pleasant 52 year old patient back for pain that she is having her left shoulder near the rhomboids for some time now. She's already had her shoulder imaged with an x-ray and this was negative, she's been to formal physical therapy for her neck and shoulder, prednisone, NSAIDs, muscle relaxers, gabapentin. Unfortunately nothing is coronary she most recently had a cervical spine MRI and is here to follow up results. She's been out of work since [employer] been seeing her. Again, her pain is localized over the left rhomboids and around the medial border of the scapula. She denies any radiation or numbness and tingling in her fingers.   Past medical history, Surgical history, Family history not pertinant except as noted below, Social history, Allergies, and medications have been entered into the medical record, reviewed, and no changes needed.   Review of Systems: No headache, visual changes, nausea, vomiting, diarrhea, constipation, dizziness, abdominal pain, skin rash, fevers, chills, night sweats, weight loss, swollen lymph nodes, body aches, joint swelling, muscle aches, chest pain, shortness of breath, mood changes, visual or auditory hallucinations.   Objective:   General: Well Developed, well nourished, and in no acute distress.  Neuro/Psych: Alert and oriented x3, extra-ocular muscles intact, able to move all 4 extremities, sensation grossly intact. Skin: Warm and dry, no rashes noted.  Respiratory: Not using accessory muscles, speaking in full sentences, trachea midline.  Cardiovascular: Pulses palpable, no extremity edema. Abdomen: Does not appear distended. Neck: Inspection unremarkable. No palpable stepoffs. Negative Spurling's maneuver. Full neck range of motion Grip strength and sensation normal in bilateral hands Strength good C4 to T1 distribution No sensory change to C4 to T1 Negative Hoffman sign bilaterally Reflexes  normal Shoulder: Inspection reveals no abnormalities, atrophy or asymmetry. Palpation is normal with no tenderness over AC joint or bicipital groove. ROM is full in all planes. Rotator cuff strength normal throughout. No signs of impingement with negative Neer and Hawkin's tests, empty can sign. Speeds and Yergason's tests normal. No labral pathology noted with negative Obrien's, negative clunk and good stability. Normal scapular function observed. No painful arc and no drop arm sign. No apprehension sign  Cervical spine MRI was personally reviewed, there is very mild degenerative disc disease at C5-6 and C6-7 levels, none of these disc protrusions cause any foraminal or spinal stenosis.  Impression and Recommendations:   This case required medical decision making of moderate complexity.

## 2014-03-20 IMAGING — CR DG LUMBAR SPINE COMPLETE 4+V
5 series · 5 of 5 positions shown · non-contrast
Comparison: None.

CLINICAL DATA: Low back pain.

LUMBAR SPINE - COMPLETE 4+ VIEW

[view not recorded (1 of 5)]
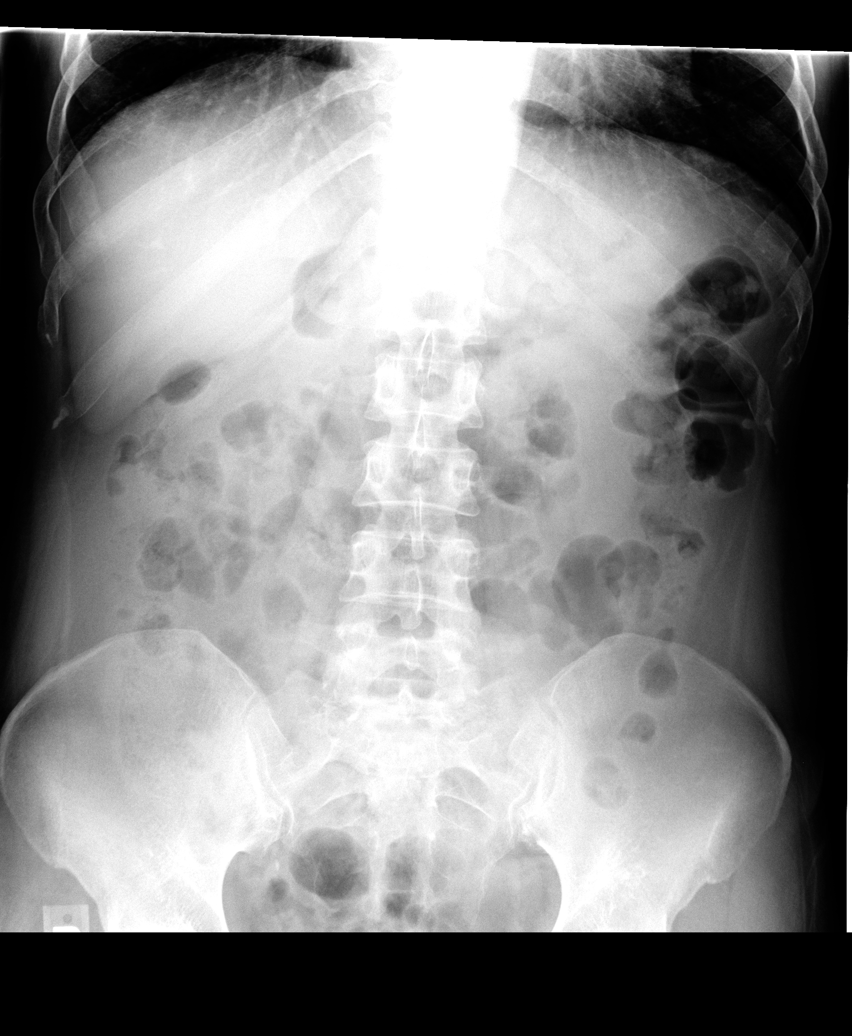

[view not recorded (2 of 5)]
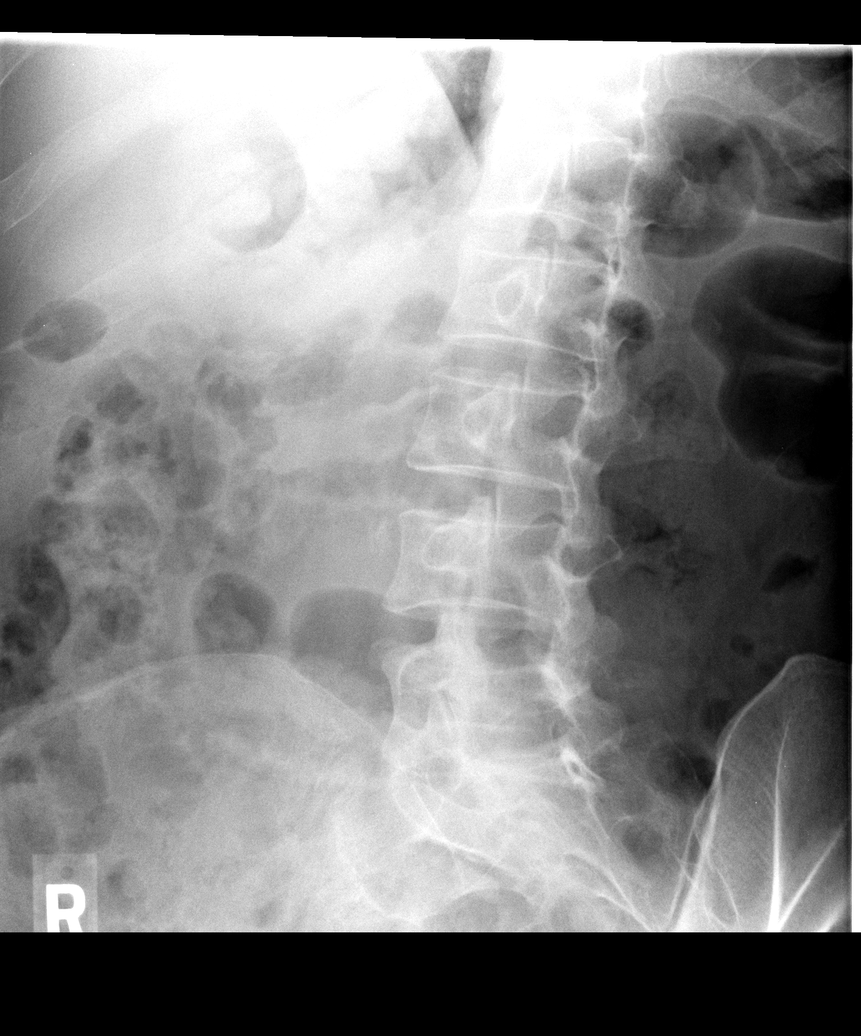

[view not recorded (3 of 5)]
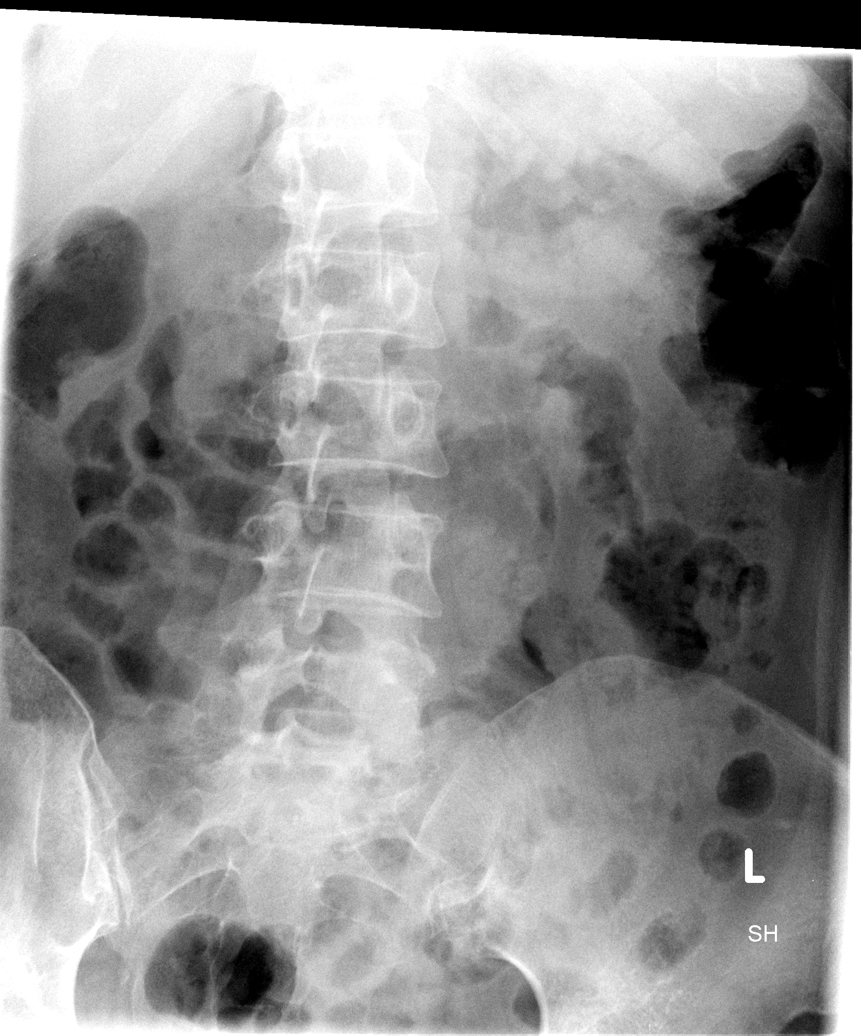

[view not recorded (4 of 5)]
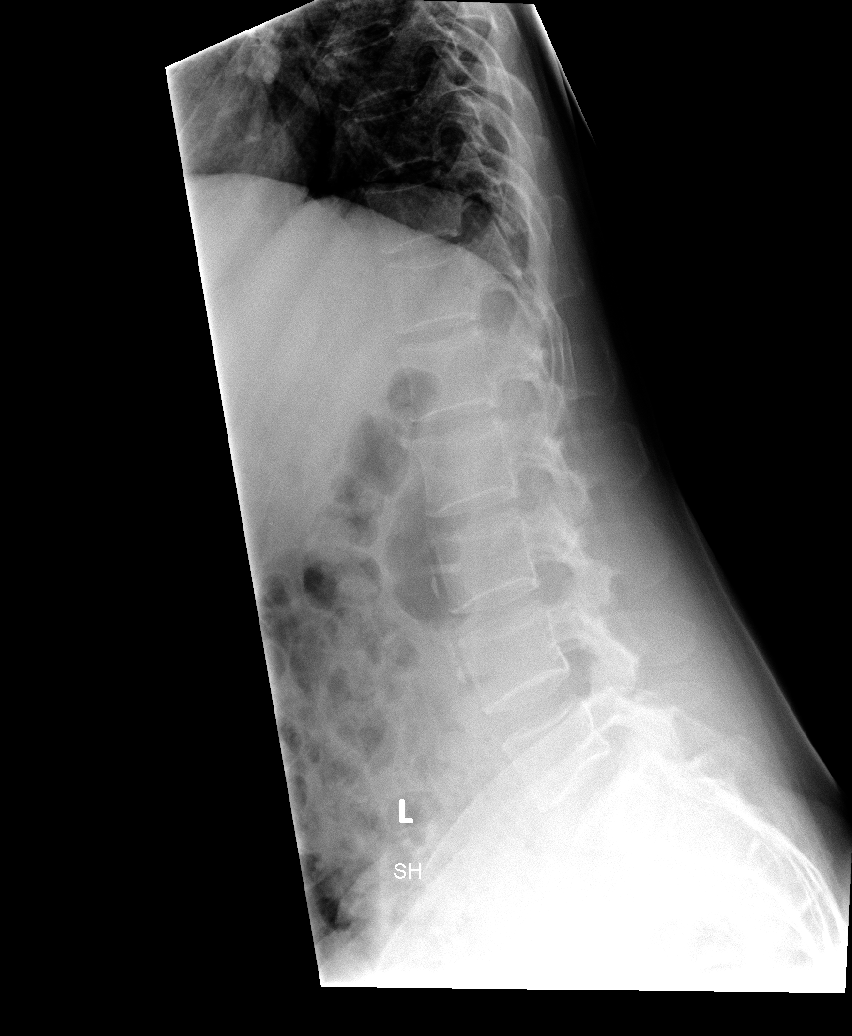

[view not recorded (5 of 5)]
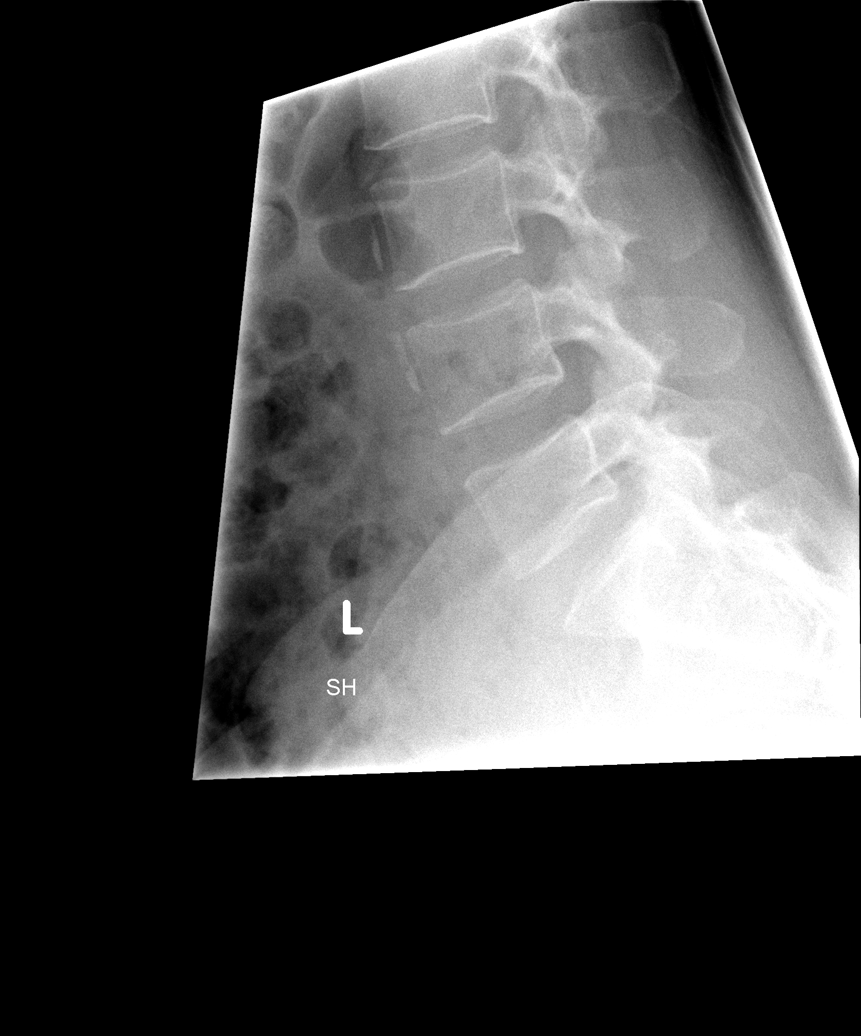

[5 of 5 positions shown; findings below may reference images not displayed]

FINDINGS: Disc spaces are maintained.  Normal alignment.  No
fracture.  Facet joints grossly unremarkable.

Calcifications anterior to the lumbar spine likely vascular, within
the aorta.  SI joints are symmetric and unremarkable.
IMPRESSION: No acute bony abnormality or evidence for significant degenerative
change.

Aortic calcifications.

## 2017-05-17 ENCOUNTER — Emergency Department (INDEPENDENT_AMBULATORY_CARE_PROVIDER_SITE_OTHER): Payer: Managed Care, Other (non HMO)

## 2017-05-17 ENCOUNTER — Other Ambulatory Visit: Payer: Self-pay

## 2017-05-17 ENCOUNTER — Emergency Department (INDEPENDENT_AMBULATORY_CARE_PROVIDER_SITE_OTHER)
Admission: EM | Admit: 2017-05-17 | Discharge: 2017-05-17 | Disposition: A | Discharge status: Home / Self Care | Payer: Managed Care, Other (non HMO) | Source: Home / Self Care | Attending: Emergency Medicine | Admitting: Emergency Medicine

## 2017-05-17 DIAGNOSIS — J101 Influenza due to other identified influenza virus with other respiratory manifestations: Secondary | ICD-10-CM | POA: Diagnosis not present

## 2017-05-17 DIAGNOSIS — R05 Cough: Secondary | ICD-10-CM

## 2017-05-17 DIAGNOSIS — R0789 Other chest pain: Secondary | ICD-10-CM | POA: Diagnosis not present

## 2017-05-17 HISTORY — DX: Gastro-esophageal reflux disease without esophagitis: K21.9

## 2017-05-17 LAB — POCT CBC W AUTO DIFF (K'VILLE URGENT CARE)

## 2017-05-17 LAB — POCT INFLUENZA A/B
INFLUENZA A, POC: POSITIVE — AB
INFLUENZA B, POC: NEGATIVE

## 2017-05-17 MED ORDER — BENZONATATE 100 MG PO CAPS: 100.0000 mg | ORAL_CAPSULE | Freq: Three times a day (TID) | ORAL | 0 refills | Status: DC | PRN

## 2017-05-17 NOTE — ED Provider Notes (Signed)
[facility]    CSN: [id number] Arrival date & time: 05/17/17  0902     History   Chief Complaint Chief Complaint  Patient presents with  . Cough    HPI Stephanie Mayer is a 57 y.o. female.  Patient presents with onset  Sunday of fever, chills, cough. She did have extreme aching which is better now. She enters today with a persistent cough. She is a smoker one pack per day. She did not have a flu shot. HPI  Past Medical History:  Diagnosis Date  . GERD (gastroesophageal reflux disease)   . Hypertension     Patient Active Problem List   Diagnosis Date Noted  . Abnormal mammogram 02/08/2012  . Hypertension 02/08/2012  . Adjustment disorder with depressed mood 11/02/2010  . Neck pain 08/17/2010  . Forearm strain 08/17/2010    History reviewed. No pertinent surgical history.  OB History    No data available       Home Medications    Prior to Admission medications   Medication Sig Start Date End Date Taking? Authorizing Provider  omeprazole (PRILOSEC) 20 MG capsule Take 20 mg by mouth daily.   Yes [provider]  benzonatate (TESSALON) 100 MG capsule Take 1-2 capsules (100-200 mg total) by mouth 3 (three) times daily as needed for cough. 05/17/17   Collene Gobble, MD    Family History Family History  Problem Relation Age of Onset  . Hypertension Father   . Cancer Maternal Aunt        breast  . Diabetes Neg Hx   . Heart attack Neg Hx     Social History Social History   Tobacco Use  . Smoking status: Current Every Day Smoker    Packs/day: 1.00    Years: 25.00    Pack years: 25.00    Types: Cigarettes  . Smokeless tobacco: Never Used  Substance Use Topics  . Alcohol use: Yes    Comment: 6-10 per week  . Drug use: No     Allergies   Patient has no known allergies.   Review of Systems Review of Systems  Constitutional: Positive for activity change and fever.  HENT: Positive for sore throat.   Eyes: Negative.   Respiratory:  Positive for cough. Negative for wheezing.   Cardiovascular: Positive for chest pain.  Gastrointestinal: Negative.   Endocrine: Negative.   Genitourinary: Negative.   Musculoskeletal: Negative.      Physical Exam Triage Vital Signs ED Triage Vitals  Enc Vitals Group     BP 05/17/17 0932 (!) 144/82     Pulse Rate 05/17/17 0932 68     Resp --      Temp 05/17/17 0932 98.8 F (37.1 C)     Temp Source 05/17/17 0932 Oral     SpO2 05/17/17 0932 98 %     Weight 05/17/17 0933 104 lb (47.2 kg)     Height --      Head Circumference --      Peak Flow --      Pain Score 05/17/17 0933 0     Pain Loc --      Pain Edu? --      Excl. in GC? --    No data found.  Updated Vital Signs BP (!) 144/82 (BP Location: Right Arm)   Pulse 68   Temp 98.8 F (37.1 C) (Oral)   Wt 104 lb (47.2 kg)   SpO2 98%   BMI 20.31 kg/m  Visual Acuity Right Eye Distance:   Left Eye Distance:   Bilateral Distance:    Right Eye Near:   Left Eye Near:    Bilateral Near:     Physical Exam   UC Treatments / Results  Labs (all labs ordered are listed, but only abnormal results are displayed) Labs Reviewed  POCT INFLUENZA A/B - Abnormal; Notable for the following components:      Result Value   Influenza A, POC Positive (*)    All other components within normal limits  POCT CBC W AUTO DIFF ([facility])    EKG  EKG Interpretation None       Radiology Dg Chest 2 View  Result Date: 05/17/2017 CLINICAL DATA:  57 year old female with a history of cough with left-sided chest pain EXAM: CHEST - 2 VIEW COMPARISON:  None. FINDINGS: Cardiomediastinal silhouette within normal limits in size and contour. No evidence of central vascular congestion. No pneumothorax or pleural effusion. No confluent airspace disease. No acute displaced fracture IMPRESSION: Negative for acute cardiopulmonary disease Electronically Signed   By: Gilmer MorJaime  Wagner D.O.   On: 05/17/2017 10:44    Procedures Procedures  (including critical care time)  Medications Ordered in UC Medications - No data to display   Initial Impression / Assessment and Plan / UC Course  I have reviewed the triage vital signs and the nursing notes.  Pertinent labs & imaging results that were available during my care of the patient were reviewed by me and considered in my medical decision making (see chart for details). Chest x-ray shows no acute disase. Influenza A is positive.she is 72 hours with symptoms. Will treat symptomatically.      Final Clinical Impressions(s) / UC Diagnoses   Final diagnoses:  Influenza A    ED Discharge Orders        Ordered    benzonatate (TESSALON) 100 MG capsule  3 times daily PRN     05/17/17 1104     Drink plenty of fluids. Decrease your smoking. If worsening symptoms go to the emergency room  Controlled Substance Prescriptions  Controlled Substance Registry consulted? Not Applicable   Collene Gobbleaub, Cornelious Bartolucci A, MD 05/17/17 1444

## 2017-05-17 NOTE — Discharge Instructions (Signed)
Drink plenty of fluids. Decrease your smoking. If worsening symptoms go to the emergency room

## 2017-05-17 NOTE — ED Triage Notes (Signed)
Pt c/o dry cough since Sunday. Some sneezing and yellow drainage. No relief with OTC cold meds.

## 2017-06-13 ENCOUNTER — Emergency Department (INDEPENDENT_AMBULATORY_CARE_PROVIDER_SITE_OTHER)
Admission: EM | Admit: 2017-06-13 | Discharge: 2017-06-13 | Disposition: A | Discharge status: Home / Self Care | Payer: Managed Care, Other (non HMO) | Source: Home / Self Care | Attending: Family Medicine | Admitting: Family Medicine

## 2017-06-13 ENCOUNTER — Other Ambulatory Visit: Payer: Self-pay

## 2017-06-13 ENCOUNTER — Emergency Department (INDEPENDENT_AMBULATORY_CARE_PROVIDER_SITE_OTHER): Payer: Managed Care, Other (non HMO)

## 2017-06-13 ENCOUNTER — Encounter: Payer: Self-pay | Admitting: *Deleted

## 2017-06-13 DIAGNOSIS — S42034A Nondisplaced fracture of lateral end of right clavicle, initial encounter for closed fracture: Secondary | ICD-10-CM

## 2017-06-13 DIAGNOSIS — S42254A Nondisplaced fracture of greater tuberosity of right humerus, initial encounter for closed fracture: Secondary | ICD-10-CM | POA: Diagnosis not present

## 2017-06-13 DIAGNOSIS — S42031A Displaced fracture of lateral end of right clavicle, initial encounter for closed fracture: Secondary | ICD-10-CM | POA: Diagnosis not present

## 2017-06-13 DIAGNOSIS — W208XXA Other cause of strike by thrown, projected or falling object, initial encounter: Secondary | ICD-10-CM

## 2017-06-13 DIAGNOSIS — S42251A Displaced fracture of greater tuberosity of right humerus, initial encounter for closed fracture: Secondary | ICD-10-CM | POA: Diagnosis not present

## 2017-06-13 MED ORDER — HYDROCODONE-ACETAMINOPHEN 5-325 MG PO TABS: 1.0000 | ORAL_TABLET | Freq: Four times a day (QID) | ORAL | 0 refills | Status: AC | PRN

## 2017-06-13 NOTE — ED Triage Notes (Signed)
Pt c/o RT arm pain after a door closed on it 3 days ago. Last dose IBF 400mg  at 0600.

## 2017-06-13 NOTE — ED Provider Notes (Signed)
[facility]    CSN: [id number] Arrival date & time: 06/13/17  0805     History   Chief Complaint Chief Complaint  Patient presents with  . Arm Pain    HPI Stephanie Mayer is a 57 y.o. female.   Four days ago while carrying a heavy bag of groceries, a heavy storm door forcefully closed against patient's right upper arm and shoulder.  She notes anterior upper arm pain and swelling, and significantly decreased shoulder range of motion.  The history is provided by the patient.  Arm Pain  This is a new problem. The current episode started more than 2 days ago. The problem occurs constantly. The problem has not changed since onset.Pertinent negatives include no chest pain and no shortness of breath. Exacerbated by: right shoulder movement. Nothing relieves the symptoms. Treatments tried: Ibuprofen. The treatment provided mild relief.    Past Medical History:  Diagnosis Date  . GERD (gastroesophageal reflux disease)   . Hypertension     Patient Active Problem List   Diagnosis Date Noted  . Abnormal mammogram 02/08/2012  . Hypertension 02/08/2012  . Adjustment disorder with depressed mood 11/02/2010  . Neck pain 08/17/2010  . Forearm strain 08/17/2010    History reviewed. No pertinent surgical history.  OB History   None      Home Medications    Prior to Admission medications   Medication Sig Start Date End Date Taking? Authorizing Provider  HYDROcodone-acetaminophen (NORCO/VICODIN) 5-325 MG tablet Take 1 tablet by mouth every 6 (six) hours as needed for up to 5 days. 06/13/17 06/18/17  Lattie Haw, MD  omeprazole (PRILOSEC) 20 MG capsule Take 20 mg by mouth daily.    [provider]    Family History Family History  Problem Relation Age of Onset  . Hypertension Father   . Cancer Maternal Aunt        breast  . Diabetes Neg Hx   . Heart attack Neg Hx     Social History Social History   Tobacco Use  . Smoking status: Current Every Day  Smoker    Packs/day: 1.00    Years: 25.00    Pack years: 25.00    Types: Cigarettes  . Smokeless tobacco: Never Used  Substance Use Topics  . Alcohol use: Yes    Comment: 6-10 per week  . Drug use: No     Allergies   Patient has no known allergies.   Review of Systems Review of Systems  Respiratory: Negative for shortness of breath.   Cardiovascular: Negative for chest pain.  Musculoskeletal: Negative for neck pain and neck stiffness.  All other systems reviewed and are negative.    Physical Exam Triage Vital Signs ED Triage Vitals  Enc Vitals Group     BP 06/13/17 0843 (!) 145/82     Pulse Rate 06/13/17 0843 87     Resp 06/13/17 0843 16     Temp 06/13/17 0843 98.6 F (37 C)     Temp Source 06/13/17 0843 Oral     SpO2 06/13/17 0843 99 %     Weight 06/13/17 0844 103 lb (46.7 kg)     Height 06/13/17 0844 5' (1.524 m)     Head Circumference --      Peak Flow --      Pain Score 06/13/17 0844 10     Pain Loc --      Pain Edu? --      Excl. in GC? --  No data found.  Updated Vital Signs BP (!) 145/82 (BP Location: Right Arm)   Pulse 87   Temp 98.6 F (37 C) (Oral)   Resp 16   Ht 5' (1.524 m)   Wt 103 lb (46.7 kg)   SpO2 99%   BMI 20.12 kg/m   Visual Acuity Right Eye Distance:   Left Eye Distance:   Bilateral Distance:    Right Eye Near:   Left Eye Near:    Bilateral Near:     Physical Exam  Constitutional: She appears well-developed and well-nourished. No distress.  HENT:  Head: Atraumatic.  Eyes: Pupils are equal, round, and reactive to light.  Neck: Normal range of motion.  Cardiovascular: Normal heart sounds.  Pulmonary/Chest: Breath sounds normal.  Musculoskeletal:       Right shoulder: She exhibits decreased range of motion, tenderness, bony tenderness, pain and decreased strength. She exhibits no swelling, no deformity, no laceration and normal pulse.       Arms:  Right shoulder has decreased internal/external rotation and strength.   The patient cannot actively or passively abduct above the horizontal.  Apley's test positive.  Unable to perform empty can test.   There is tenderness over the right AC joint and mid scapula without swelling or deformity.  There is tenderness and resolving ecchymosis over the mid-humerus anteriorly.  There is tenderness to palpation over the greater tuberosity.  Neurological: She is alert.  Skin: Skin is warm and dry.  Nursing note and vitals reviewed.    UC Treatments / Results  Labs (all labs ordered are listed, but only abnormal results are displayed) Labs Reviewed - No data to display  EKG None Radiology Dg Clavicle Right  Result Date: 06/13/2017 CLINICAL DATA:  Heavy door hit anterior right shoulder. EXAM: RIGHT CLAVICLE - 2+ VIEWS COMPARISON:  None. FINDINGS: Acute nondisplaced fracture of the distal right clavicle involving the articular surface at the acromioclavicular joint. No other fracture or dislocation. No AC joint separation. No aggressive osseous lesion. IMPRESSION: 1. Acute nondisplaced fracture of the distal right clavicle involving the articular surface at the acromioclavicular joint. Electronically Signed   By: Elige KoHetal  Patel   On: 06/13/2017 09:10   Dg Shoulder Right  Result Date: 06/13/2017 CLINICAL DATA:  Injury 4 days ago. Proximal right humeral pain and shoulder pain. EXAM: RIGHT SHOULDER - 2+ VIEW COMPARISON:  Right humerus performed today FINDINGS: Fracture through the greater tuberosity of the right humerus. There is probable extension across the humeral neck. No subluxation or dislocation. Degenerative changes in the right AC joint. IMPRESSION: Proximal right humeral fracture involving the greater tuberosity and appears to extend across the humeral neck. Electronically Signed   By: Charlett NoseKevin  Dover M.D.   On: 06/13/2017 09:10   Dg Humerus Right  Result Date: 06/13/2017 CLINICAL DATA:  Injury.  Right shoulder and humeral pain. EXAM: RIGHT HUMERUS - 2+ VIEW COMPARISON:   Right shoulder series performed today FINDINGS: Fracture noted in the proximal right humerus involving the greater tuberosity and likely extending across the humeral neck, nondisplaced. No subluxation or dislocation. Degenerative changes in the right AC joint. IMPRESSION: Proximal right humeral fracture involving the greater tuberosity and likely the humeral neck. Electronically Signed   By: Charlett NoseKevin  Dover M.D.   On: 06/13/2017 09:10    Procedures Procedures (including critical care time)  Medications Ordered in UC Medications - No data to display   Initial Impression / Assessment and Plan / UC Course  I have reviewed the triage  vital signs and the nursing notes.  Pertinent labs & imaging results that were available during my care of the patient were reviewed by me and considered in my medical decision making (see chart for details).    Applied sling/immobilizer.  Apply ice pack for 20 to 30 minutes, 3 to 4 times daily  Continue until pain and swelling decrease.  Rx for Lortab. Controlled Substance Prescriptions I have consulted the [place] Controlled Substances Registry for this patient, and feel the risk/benefit ratio today is favorable for proceeding with this prescription for a controlled substance.   Followup with Dr. Rodney Langton on 06/22/17.for fracture management. Patient has had several fractures in the past; suggest checking bone density and Vitamin D level.     Final Clinical Impressions(s) / UC Diagnoses   Final diagnoses:  Closed nondisplaced fracture of acromial end of right clavicle, initial encounter  Closed nondisplaced fracture of greater tuberosity of right humerus, initial encounter    ED Discharge Orders        Ordered    HYDROcodone-acetaminophen (NORCO/VICODIN) 5-325 MG tablet  Every 6 hours PRN     06/13/17 0958           Lattie Haw, MD 06/13/17 1013

## 2017-06-13 NOTE — Discharge Instructions (Addendum)
Wear sling immobilizer.  Apply ice pack for 20 to 30 minutes, 3 to 4 times daily  Continue until pain and swelling decrease.

## 2017-06-22 ENCOUNTER — Encounter: Payer: Self-pay | Admitting: Sports Medicine

## 2017-06-22 ENCOUNTER — Ambulatory Visit (INDEPENDENT_AMBULATORY_CARE_PROVIDER_SITE_OTHER): Payer: Managed Care, Other (non HMO) | Admitting: Sports Medicine

## 2017-06-22 DIAGNOSIS — S42001A Fracture of unspecified part of right clavicle, initial encounter for closed fracture: Secondary | ICD-10-CM | POA: Insufficient documentation

## 2017-06-22 DIAGNOSIS — S42034A Nondisplaced fracture of lateral end of right clavicle, initial encounter for closed fracture: Secondary | ICD-10-CM

## 2017-06-22 DIAGNOSIS — S42254A Nondisplaced fracture of greater tuberosity of right humerus, initial encounter for closed fracture: Secondary | ICD-10-CM | POA: Diagnosis not present

## 2017-06-22 MED ORDER — HYDROCODONE-ACETAMINOPHEN 5-325 MG PO TABS: 1.0000 | ORAL_TABLET | Freq: Two times a day (BID) | ORAL | 0 refills | Status: DC | PRN

## 2017-06-22 NOTE — Assessment & Plan Note (Signed)
Continue sling, refilling hydrocodone. Return in 2 weeks, x-ray before visit, we have also discussed nicotine replacement as a form of smoking cessation in the meantime while the fracture heals.  I billed a fracture code for this encounter, all subsequent visits will be post-op checks in the global period.

## 2017-06-22 NOTE — Assessment & Plan Note (Signed)
Continue sling, refilling hydrocodone. Return in 2 weeks, x-ray before visit, we have also discussed nicotine replacement as a form of smoking cessation in the meantime while the fracture heals.  I billed a fracture code for this encounter, all subsequent visits will be post-op checks in the global period. 

## 2017-06-22 NOTE — Progress Notes (Signed)
Subjective:    I'm seeing this patient as a consultation for:  Dr. Donna Christen  CC: Right arm injury  HPI: Little over a week ago this pleasant 57 year old female was leaving her house, unfortunately the storm door swung quickly and hit her.  She had immediate pain, inability to use the arm.  She was seen in urgent care where x-rays showed a fracture of the distal clavicle as well as the greater tuberosity of the humerus, she was appropriately placed in a sling, and referred to me for further evaluation and definitive treatment, overall she is doing okay, pain is controlled with hydrocodone, unfortunately she is continuing to smoke about 1 pack/day.  Declines Chantix, declines Wellbutrin.  I reviewed the past medical history, family history, social history, surgical history, and allergies today and no changes were needed.  Please see the problem list section below in epic for further details.  Past Medical History: Past Medical History:  Diagnosis Date  . GERD (gastroesophageal reflux disease)   . Hypertension    Past Surgical History: No past surgical history on file. Social History: Social History   Socioeconomic History  . Marital status: Widowed    Spouse name: Not on file  . Number of children: Not on file  . Years of education: Not on file  . Highest education level: Not on file  Occupational History  . Not on file  Social Needs  . Financial resource strain: Not on file  . Food insecurity:    Worry: Not on file    Inability: Not on file  . Transportation needs:    Medical: Not on file    Non-medical: Not on file  Tobacco Use  . Smoking status: Current Every Day Smoker    Packs/day: 1.00    Years: 25.00    Pack years: 25.00    Types: Cigarettes  . Smokeless tobacco: Never Used  Substance and Sexual Activity  . Alcohol use: Yes    Comment: 6-10 per week  . Drug use: No  . Sexual activity: Not on file  Lifestyle  . Physical activity:    Days per week: Not  on file    Minutes per session: Not on file  . Stress: Not on file  Relationships  . Social connections:    Talks on phone: Not on file    Gets together: Not on file    Attends religious service: Not on file    Active member of club or organization: Not on file    Attends meetings of clubs or organizations: Not on file    Relationship status: Not on file  Other Topics Concern  . Not on file  Social History Narrative  . Not on file   Family History: Family History  Problem Relation Age of Onset  . Hypertension Father   . Cancer Maternal Aunt        breast  . Diabetes Neg Hx   . Heart attack Neg Hx    Allergies: No Known Allergies Medications: See med rec.  Review of Systems: No headache, visual changes, nausea, vomiting, diarrhea, constipation, dizziness, abdominal pain, skin rash, fevers, chills, night sweats, weight loss, swollen lymph nodes, body aches, joint swelling, muscle aches, chest pain, shortness of breath, mood changes, visual or auditory hallucinations.   Objective:   General: Well Developed, well nourished, and in no acute distress.  Neuro:  Extra-ocular muscles intact, able to move all 4 extremities, sensation grossly intact.  Deep tendon reflexes tested were  normal. Psych: Alert and oriented, mood congruent with affect. ENT:  Ears and nose appear unremarkable.  Hearing grossly normal. Neck: Unremarkable overall appearance, trachea midline.  No visible thyroid enlargement. Eyes: Conjunctivae and lids appear unremarkable.  Pupils equal and round. Skin: Warm and dry, no rashes noted.  Cardiovascular: Pulses palpable, no extremity edema. Right shoulder: Tender to palpation over the greater tuberosity, distal clavicle, neurovascularly intact distally, I did not attempt to aggressively examine the arm.  X-rays personally reviewed, there is a small avulsion type fracture from the distal clavicle, she also has a nondisplaced fracture through the greater tuberosity of  the humerus.  Impression and Recommendations:   This case required medical decision making of moderate complexity.  Fracture of clavicle, right, closed Continue sling, refilling hydrocodone. Return in 2 weeks, x-ray before visit, we have also discussed nicotine replacement as a form of smoking cessation in the meantime while the fracture heals.  I billed a fracture code for this encounter, all subsequent visits will be post-op checks in the global period.  Closed nondisplaced fracture of greater tuberosity of right humerus Continue sling, refilling hydrocodone. Return in 2 weeks, x-ray before visit, we have also discussed nicotine replacement as a form of smoking cessation in the meantime while the fracture heals.  I billed a fracture code for this encounter, all subsequent visits will be post-op checks in the global period. ___________________________________________ Ihor Austinhomas J. Benjamin Stainhekkekandam, M.D., ABFM., CAQSM. Primary Care and Sports Medicine [facility]  Adjunct Instructor of Family Medicine  [employer]

## 2017-07-06 ENCOUNTER — Ambulatory Visit (INDEPENDENT_AMBULATORY_CARE_PROVIDER_SITE_OTHER): Payer: Managed Care, Other (non HMO) | Admitting: Sports Medicine

## 2017-07-06 ENCOUNTER — Ambulatory Visit (INDEPENDENT_AMBULATORY_CARE_PROVIDER_SITE_OTHER): Payer: Managed Care, Other (non HMO)

## 2017-07-06 ENCOUNTER — Encounter: Payer: Self-pay | Admitting: Sports Medicine

## 2017-07-06 DIAGNOSIS — W228XXD Striking against or struck by other objects, subsequent encounter: Secondary | ICD-10-CM

## 2017-07-06 DIAGNOSIS — S42254A Nondisplaced fracture of greater tuberosity of right humerus, initial encounter for closed fracture: Secondary | ICD-10-CM

## 2017-07-06 DIAGNOSIS — S42254D Nondisplaced fracture of greater tuberosity of right humerus, subsequent encounter for fracture with routine healing: Secondary | ICD-10-CM

## 2017-07-06 DIAGNOSIS — S42034D Nondisplaced fracture of lateral end of right clavicle, subsequent encounter for fracture with routine healing: Secondary | ICD-10-CM

## 2017-07-06 NOTE — Progress Notes (Signed)
  Subjective: This is a pleasant 57 year old female, I am seeing her for nondisplaced fractures of the right greater tuberosity of the humerus and the right clavicle.  She is now approximately 3 weeks post injury, doing well.  Objective: General: Well-developed, well-nourished, and in no acute distress. Right shoulder: Minimal tenderness over the clavicle, as well as over the greater tuberosity, otherwise motion not tested, she is immobilized in a sling.  X-rays reviewed, they show stability of the nondisplaced clavicular as well as humeral greater tuberosity fractures, I do see some bony callus around the greater tuberosity fracture.  Assessment/plan:   Closed nondisplaced fracture of greater tuberosity of right humerus Doing well in a sling, return to see me in approximately 3 more weeks.   Fracture of clavicle, right, closed Doing well in a sling, return to see me in approximately 3 more weeks. ___________________________________________ Ihor Austin. Benjamin Stain, M.D., ABFM., CAQSM. Primary Care and Sports Medicine [facility]  Adjunct Instructor of Family Medicine  [employer]

## 2017-07-06 NOTE — Assessment & Plan Note (Addendum)
Doing well in a sling, return to see me in approximately 3 more weeks.

## 2017-07-06 NOTE — Assessment & Plan Note (Addendum)
Doing well in a sling, return to see me in approximately 3 more weeks. 

## 2017-07-28 ENCOUNTER — Encounter: Payer: Self-pay | Admitting: Sports Medicine

## 2017-07-28 ENCOUNTER — Ambulatory Visit (INDEPENDENT_AMBULATORY_CARE_PROVIDER_SITE_OTHER): Payer: Managed Care, Other (non HMO) | Admitting: Sports Medicine

## 2017-07-28 DIAGNOSIS — S42034D Nondisplaced fracture of lateral end of right clavicle, subsequent encounter for fracture with routine healing: Secondary | ICD-10-CM

## 2017-07-28 DIAGNOSIS — S42254D Nondisplaced fracture of greater tuberosity of right humerus, subsequent encounter for fracture with routine healing: Secondary | ICD-10-CM

## 2017-07-28 NOTE — Progress Notes (Signed)
  Subjective: Stephanie Mayer is 6 weeks post right clavicular and right greater tuberosity fracture is currently pain-free.  Objective: General: Well-developed, well-nourished, and in no acute distress. Right shoulder: Inspection reveals no abnormalities, atrophy or asymmetry. Palpation is normal with no tenderness over AC joint or bicipital groove. ROM is full in all planes. Rotator cuff strength normal throughout. No signs of impingement with negative Neer and Hawkin's tests, empty can. Speeds and Yergason's tests normal. No labral pathology noted with negative Obrien's, negative crank, negative clunk, and good stability. Normal scapular function observed. No painful arc and no drop arm sign. No apprehension sign  Assessment/plan:   Fracture of clavicle, right, closed Pain-free, may return to work without restrictions.   Work note filled out today.  Closed nondisplaced fracture of greater tuberosity of right humerus Pain-free, may return to work without restrictions.   Work note filled out today. ___________________________________________ Ihor Austin. Benjamin Stain, M.D., ABFM., CAQSM. Primary Care and Sports Medicine [facility]  Adjunct Instructor of Family Medicine  [employer]

## 2017-07-28 NOTE — Assessment & Plan Note (Signed)
Pain-free, may return to work without restrictions.   Work note filled out today.

## 2017-07-28 NOTE — Assessment & Plan Note (Signed)
Pain-free, may return to work without restrictions.   Work note filled out today. 

## 2017-08-21 ENCOUNTER — Other Ambulatory Visit: Payer: Self-pay

## 2017-08-21 ENCOUNTER — Encounter: Payer: Self-pay | Admitting: *Deleted

## 2017-08-21 ENCOUNTER — Emergency Department (INDEPENDENT_AMBULATORY_CARE_PROVIDER_SITE_OTHER): Payer: Managed Care, Other (non HMO)

## 2017-08-21 ENCOUNTER — Emergency Department (INDEPENDENT_AMBULATORY_CARE_PROVIDER_SITE_OTHER)
Admission: EM | Admit: 2017-08-21 | Discharge: 2017-08-21 | Disposition: A | Discharge status: Home / Self Care | Payer: Managed Care, Other (non HMO) | Source: Home / Self Care | Attending: Family Medicine | Admitting: Family Medicine

## 2017-08-21 DIAGNOSIS — S20211A Contusion of right front wall of thorax, initial encounter: Secondary | ICD-10-CM | POA: Diagnosis not present

## 2017-08-21 DIAGNOSIS — S2231XA Fracture of one rib, right side, initial encounter for closed fracture: Secondary | ICD-10-CM

## 2017-08-21 DIAGNOSIS — M5489 Other dorsalgia: Secondary | ICD-10-CM

## 2017-08-21 DIAGNOSIS — S7001XA Contusion of right hip, initial encounter: Secondary | ICD-10-CM

## 2017-08-21 DIAGNOSIS — R0602 Shortness of breath: Secondary | ICD-10-CM | POA: Diagnosis not present

## 2017-08-21 MED ORDER — TRAMADOL HCL 50 MG PO TABS: 50.0000 mg | ORAL_TABLET | Freq: Four times a day (QID) | ORAL | 0 refills | Status: AC | PRN

## 2017-08-21 NOTE — Discharge Instructions (Addendum)
Apply ice pack for 20 to 30 minutes, 3 to 4 times daily  Continue until pain and swelling decrease.  May take Tylenol as needed for pain.  Recommend follow-up with family doctor to check bone density and Vitamin D blood levels.

## 2017-08-21 NOTE — ED Provider Notes (Signed)
[facility]    CSN: [id number] Arrival date & time: 08/21/17  [id number]     History   Chief Complaint Chief Complaint  Patient presents with  . Rib Injury  . Hip Pain    HPI Stephanie Mayer is a 57 y.o. female.   Patient slipped in bathroom two days ago and fell, striking her right lateral chest on edge of commode.  No loss of consciousness.  She has had persistent pain in her right chest, worse with inspiration and movement but no shortness of breath.  She also has a bruise above her right hip, but no pain with ambulation.  The history is provided by the patient.  Hip Pain  This is a new problem. The current episode started 2 days ago. The problem occurs constantly. The problem has not changed since onset.Associated symptoms include chest pain. Pertinent negatives include no abdominal pain and no shortness of breath. The symptoms are aggravated by twisting, coughing and bending. Nothing relieves the symptoms. She has tried acetaminophen (ice packs) for the symptoms. The treatment provided mild relief.    Past Medical History:  Diagnosis Date  . GERD (gastroesophageal reflux disease)   . Hypertension     Patient Active Problem List   Diagnosis Date Noted  . Closed nondisplaced fracture of greater tuberosity of right humerus 06/22/2017  . Fracture of clavicle, right, closed 06/22/2017  . Abnormal mammogram 02/08/2012  . Hypertension 02/08/2012  . Adjustment disorder with depressed mood 11/02/2010  . Neck pain 08/17/2010  . Forearm strain 08/17/2010    History reviewed. No pertinent surgical history.  OB History   None      Home Medications    Prior to Admission medications   Medication Sig Start Date End Date Taking? Authorizing Provider  omeprazole (PRILOSEC) 20 MG capsule Take 20 mg by mouth daily.    [provider]  traMADol (ULTRAM) 50 MG tablet Take 1 tablet (50 mg total) by mouth every 6 (six) hours as needed for up to 5 days for moderate  pain. 08/21/17 08/26/17  Lattie HawBeese, Stephen A, MD    Family History Family History  Problem Relation Age of Onset  . Hypertension Father   . Cancer Maternal Aunt        breast  . Diabetes Neg Hx   . Heart attack Neg Hx     Social History Social History   Tobacco Use  . Smoking status: Current Every Day Smoker    Packs/day: 1.00    Years: 25.00    Pack years: 25.00    Types: Cigarettes  . Smokeless tobacco: Never Used  Substance Use Topics  . Alcohol use: Yes    Comment: 6-10 per week  . Drug use: No     Allergies   Patient has no known allergies.   Review of Systems Review of Systems  Respiratory: Negative for shortness of breath.   Cardiovascular: Positive for chest pain.  Gastrointestinal: Negative for abdominal pain.  All other systems reviewed and are negative.    Physical Exam Triage Vital Signs ED Triage Vitals  Enc Vitals Group     BP 08/21/17 0828 (!) 171/98     Pulse Rate 08/21/17 0828 90     Resp 08/21/17 0828 16     Temp 08/21/17 0828 98.4 F (36.9 C)     Temp Source 08/21/17 0828 Oral     SpO2 08/21/17 0828 90 %     Weight 08/21/17 0829 105 lb (47.6 kg)  Height 08/21/17 0829 5' (1.524 m)     Head Circumference --      Peak Flow --      Pain Score 08/21/17 0828 10     Pain Loc --      Pain Edu? --      Excl. in GC? --    No data found.  Updated Vital Signs BP (!) 171/98 (BP Location: Left Arm)   Pulse 90   Temp 98.4 F (36.9 C) (Oral)   Resp 16   Ht 5' (1.524 m)   Wt 105 lb (47.6 kg)   SpO2 90%   BMI 20.51 kg/m   Visual Acuity Right Eye Distance:   Left Eye Distance:   Bilateral Distance:    Right Eye Near:   Left Eye Near:    Bilateral Near:     Physical Exam  Constitutional: She appears well-developed and well-nourished. No distress.  HENT:  Head: Atraumatic.  Right Ear: External ear normal.  Left Ear: External ear normal.  Nose: Nose normal.  Mouth/Throat: Oropharynx is clear and moist.  Eyes: Pupils are equal,  round, and reactive to light. Conjunctivae are normal.  Neck: Normal range of motion.  Cardiovascular: Normal heart sounds.  Pulmonary/Chest: Breath sounds normal.      Tenderness right lateral ribs as noted on diagram.  No ecchymosis or swelling.    Abdominal: There is no tenderness.  Musculoskeletal: She exhibits no edema.       Legs: 5 cm diameter hematoma beneath right iliac crest.  Right hip has full range of motion.  Neurological: She is alert.  Skin: Skin is warm and dry.     UC Treatments / Results  Labs (all labs ordered are listed, but only abnormal results are displayed) Labs Reviewed - No data to display  EKG None  Radiology Dg Ribs Unilateral W/chest Right  Result Date: 08/21/2017 CLINICAL DATA:  Right posterior back pain and shortness of breath after falling 2 days ago. EXAM: RIGHT RIBS AND CHEST - 3+ VIEW COMPARISON:  Chest radiographs 05/17/2017. FINDINGS: The heart size and mediastinal contours are stable. The lungs are clear. There is no pleural effusion or pneumothorax. Old rib fractures are present bilaterally. Possible acute nondisplaced fracture of the right 8th rib anterolaterally, near the area of pain marked with a metallic BB. IMPRESSION: Possible acute nondisplaced fracture of the right 8th rib anterolaterally. Several old rib fractures are present bilaterally. No evidence of pneumothorax or significant pleural effusion. Electronically Signed   By: Carey Bullocks M.D.   On: 08/21/2017 09:01    Procedures Procedures (including critical care time)  Medications Ordered in UC Medications - No data to display  Initial Impression / Assessment and Plan / UC Course  I have reviewed the triage vital signs and the nursing notes.  Pertinent labs & imaging results that were available during my care of the patient were reviewed by me and considered in my medical decision making (see chart for details).    Rx for Tramadol. Controlled Substance  Prescriptions I have consulted the [place] Controlled Substances Registry for this patient, and feel the risk/benefit ratio today is favorable for proceeding with this prescription for a controlled substance.    Final Clinical Impressions(s) / UC Diagnoses   Final diagnoses:  Contusion of right chest wall, initial encounter  Contusion of right hip region  Fracture of one rib, right side, initial encounter for closed fracture     Discharge Instructions     Apply ice  pack for 20 to 30 minutes, 3 to 4 times daily  Continue until pain and swelling decrease.  May take Tylenol as needed for pain.  Recommend follow-up with family doctor to check bone density and Vitamin D blood levels.    ED Prescriptions    Medication Sig Dispense Auth. Provider   traMADol (ULTRAM) 50 MG tablet Take 1 tablet (50 mg total) by mouth every 6 (six) hours as needed for up to 5 days for moderate pain. 15 tablet Lattie Haw, MD         Lattie Haw, MD 08/21/17 [phone]

## 2017-08-21 NOTE — ED Triage Notes (Signed)
Pt c/o of pain on the RT side of her body from her ribs to her hip x 2 days post fall. Last dose Tylenol last night.
